# Patient Record
Sex: Male | Born: 1935 | Race: Black or African American | Hispanic: No | Smoking: Former smoker
Health system: Southern US, Community
[De-identification: ages and names within clinical notes are randomized; demographics above are authoritative.]

## PROBLEM LIST (undated history)

## (undated) DIAGNOSIS — D509 Iron deficiency anemia, unspecified: Secondary | ICD-10-CM

## (undated) DIAGNOSIS — I2510 Atherosclerotic heart disease of native coronary artery without angina pectoris: Secondary | ICD-10-CM

## (undated) DIAGNOSIS — Z95810 Presence of automatic (implantable) cardiac defibrillator: Secondary | ICD-10-CM

## (undated) DIAGNOSIS — M1990 Unspecified osteoarthritis, unspecified site: Secondary | ICD-10-CM

## (undated) DIAGNOSIS — I10 Essential (primary) hypertension: Secondary | ICD-10-CM

## (undated) DIAGNOSIS — I509 Heart failure, unspecified: Secondary | ICD-10-CM

## (undated) DIAGNOSIS — E7800 Pure hypercholesterolemia, unspecified: Secondary | ICD-10-CM

## (undated) DIAGNOSIS — J189 Pneumonia, unspecified organism: Secondary | ICD-10-CM

## (undated) DIAGNOSIS — J449 Chronic obstructive pulmonary disease, unspecified: Secondary | ICD-10-CM

## (undated) DIAGNOSIS — I219 Acute myocardial infarction, unspecified: Secondary | ICD-10-CM

## (undated) DIAGNOSIS — N183 Chronic kidney disease, stage 3 unspecified: Secondary | ICD-10-CM

## (undated) DIAGNOSIS — Z9981 Dependence on supplemental oxygen: Secondary | ICD-10-CM

## (undated) HISTORY — PX: INSERT / REPLACE / REMOVE PACEMAKER: SUR710

## (undated) HISTORY — PX: CARDIAC CATHETERIZATION: SHX172

## (undated) HISTORY — PX: CORONARY ARTERY BYPASS GRAFT: SHX141

---

## 2016-10-05 ENCOUNTER — Emergency Department (HOSPITAL_COMMUNITY): Payer: Medicare (Managed Care)

## 2016-10-05 ENCOUNTER — Encounter (HOSPITAL_COMMUNITY): Payer: Self-pay | Admitting: Emergency Medicine

## 2016-10-05 ENCOUNTER — Inpatient Hospital Stay (HOSPITAL_COMMUNITY)
Admission: EM | Admit: 2016-10-05 | Discharge: 2016-10-08 | DRG: 292 | Disposition: A | Discharge status: Home Health | Payer: Medicare (Managed Care) | Attending: Internal Medicine | Admitting: Internal Medicine

## 2016-10-05 DIAGNOSIS — Z9981 Dependence on supplemental oxygen: Secondary | ICD-10-CM

## 2016-10-05 DIAGNOSIS — D649 Anemia, unspecified: Secondary | ICD-10-CM | POA: Diagnosis present

## 2016-10-05 DIAGNOSIS — Z79899 Other long term (current) drug therapy: Secondary | ICD-10-CM

## 2016-10-05 DIAGNOSIS — Z7951 Long term (current) use of inhaled steroids: Secondary | ICD-10-CM

## 2016-10-05 DIAGNOSIS — I5021 Acute systolic (congestive) heart failure: Secondary | ICD-10-CM

## 2016-10-05 DIAGNOSIS — J441 Chronic obstructive pulmonary disease with (acute) exacerbation: Secondary | ICD-10-CM | POA: Diagnosis present

## 2016-10-05 DIAGNOSIS — J9611 Chronic respiratory failure with hypoxia: Secondary | ICD-10-CM | POA: Diagnosis present

## 2016-10-05 DIAGNOSIS — J449 Chronic obstructive pulmonary disease, unspecified: Secondary | ICD-10-CM | POA: Diagnosis present

## 2016-10-05 DIAGNOSIS — R011 Cardiac murmur, unspecified: Secondary | ICD-10-CM | POA: Diagnosis present

## 2016-10-05 DIAGNOSIS — Z87891 Personal history of nicotine dependence: Secondary | ICD-10-CM

## 2016-10-05 DIAGNOSIS — I509 Heart failure, unspecified: Secondary | ICD-10-CM

## 2016-10-05 DIAGNOSIS — Z95810 Presence of automatic (implantable) cardiac defibrillator: Secondary | ICD-10-CM

## 2016-10-05 DIAGNOSIS — I5023 Acute on chronic systolic (congestive) heart failure: Principal | ICD-10-CM | POA: Diagnosis present

## 2016-10-05 DIAGNOSIS — I2510 Atherosclerotic heart disease of native coronary artery without angina pectoris: Secondary | ICD-10-CM | POA: Diagnosis present

## 2016-10-05 DIAGNOSIS — Z7982 Long term (current) use of aspirin: Secondary | ICD-10-CM

## 2016-10-05 DIAGNOSIS — N189 Chronic kidney disease, unspecified: Secondary | ICD-10-CM | POA: Diagnosis present

## 2016-10-05 DIAGNOSIS — R0602 Shortness of breath: Secondary | ICD-10-CM | POA: Diagnosis not present

## 2016-10-05 DIAGNOSIS — I440 Atrioventricular block, first degree: Secondary | ICD-10-CM | POA: Diagnosis present

## 2016-10-05 DIAGNOSIS — I4510 Unspecified right bundle-branch block: Secondary | ICD-10-CM | POA: Diagnosis present

## 2016-10-05 DIAGNOSIS — D696 Thrombocytopenia, unspecified: Secondary | ICD-10-CM | POA: Diagnosis present

## 2016-10-05 DIAGNOSIS — I255 Ischemic cardiomyopathy: Secondary | ICD-10-CM | POA: Diagnosis present

## 2016-10-05 DIAGNOSIS — N184 Chronic kidney disease, stage 4 (severe): Secondary | ICD-10-CM | POA: Diagnosis present

## 2016-10-05 DIAGNOSIS — Z951 Presence of aortocoronary bypass graft: Secondary | ICD-10-CM

## 2016-10-05 HISTORY — DX: Essential (primary) hypertension: I10

## 2016-10-05 HISTORY — DX: Unspecified osteoarthritis, unspecified site: M19.90

## 2016-10-05 HISTORY — DX: Pneumonia, unspecified organism: J18.9

## 2016-10-05 HISTORY — DX: Pure hypercholesterolemia, unspecified: E78.00

## 2016-10-05 HISTORY — DX: Iron deficiency anemia, unspecified: D50.9

## 2016-10-05 HISTORY — DX: Atherosclerotic heart disease of native coronary artery without angina pectoris: I25.10

## 2016-10-05 HISTORY — DX: Dependence on supplemental oxygen: Z99.81

## 2016-10-05 HISTORY — DX: Acute myocardial infarction, unspecified: I21.9

## 2016-10-05 HISTORY — DX: Heart failure, unspecified: I50.9

## 2016-10-05 HISTORY — DX: Chronic obstructive pulmonary disease, unspecified: J44.9

## 2016-10-05 HISTORY — DX: Chronic kidney disease, stage 3 (moderate): N18.3

## 2016-10-05 HISTORY — DX: Presence of automatic (implantable) cardiac defibrillator: Z95.810

## 2016-10-05 HISTORY — DX: Chronic kidney disease, stage 3 unspecified: N18.30

## 2016-10-05 LAB — CBC WITH DIFFERENTIAL/PLATELET
BASOS PCT: 0 %
Basophils Absolute: 0 10*3/uL (ref 0.0–0.1)
EOS ABS: 0 10*3/uL (ref 0.0–0.7)
EOS PCT: 0 %
HCT: 35.7 % — ABNORMAL LOW (ref 39.0–52.0)
HEMOGLOBIN: 11 g/dL — AB (ref 13.0–17.0)
Lymphocytes Relative: 22 %
Lymphs Abs: 1.2 10*3/uL (ref 0.7–4.0)
MCH: 26.6 pg (ref 26.0–34.0)
MCHC: 30.8 g/dL (ref 30.0–36.0)
MCV: 86.2 fL (ref 78.0–100.0)
Monocytes Absolute: 0.6 10*3/uL (ref 0.1–1.0)
Monocytes Relative: 11 %
NEUTROS PCT: 67 %
Neutro Abs: 3.8 10*3/uL (ref 1.7–7.7)
PLATELETS: 92 10*3/uL — AB (ref 150–400)
RBC: 4.14 MIL/uL — AB (ref 4.22–5.81)
RDW: 16.7 % — ABNORMAL HIGH (ref 11.5–15.5)
WBC: 5.6 10*3/uL (ref 4.0–10.5)

## 2016-10-05 LAB — BASIC METABOLIC PANEL
Anion gap: 8 (ref 5–15)
BUN: 22 mg/dL — AB (ref 6–20)
CHLORIDE: 103 mmol/L (ref 101–111)
CO2: 29 mmol/L (ref 22–32)
CREATININE: 1.68 mg/dL — AB (ref 0.61–1.24)
Calcium: 10 mg/dL (ref 8.9–10.3)
GFR, EST AFRICAN AMERICAN: 42 mL/min — AB (ref >60)
GFR, EST NON AFRICAN AMERICAN: 36 mL/min — AB (ref >60)
Glucose, Bld: 141 mg/dL — ABNORMAL HIGH (ref 65–99)
Potassium: 4.6 mmol/L (ref 3.5–5.1)
SODIUM: 140 mmol/L (ref 135–145)

## 2016-10-05 LAB — I-STAT TROPONIN, ED: TROPONIN I, POC: 0.04 ng/mL (ref 0.00–0.08)

## 2016-10-05 LAB — BRAIN NATRIURETIC PEPTIDE: B Natriuretic Peptide: >4500 pg/mL — ABNORMAL HIGH (ref 0.0–100.0)

## 2016-10-05 MED ORDER — FUROSEMIDE 10 MG/ML IJ SOLN
40.0000 mg | Freq: Once | INTRAMUSCULAR | Status: AC
  Administered 2016-10-05: 40 mg via INTRAVENOUS
  Filled 2016-10-05: qty 4

## 2016-10-05 MED ORDER — IPRATROPIUM BROMIDE 0.02 % IN SOLN
0.5000 mg | Freq: Once | RESPIRATORY_TRACT | Status: AC
  Administered 2016-10-05: 0.5 mg via RESPIRATORY_TRACT
  Filled 2016-10-05: qty 2.5

## 2016-10-05 MED ORDER — ALBUTEROL SULFATE (2.5 MG/3ML) 0.083% IN NEBU
5.0000 mg | INHALATION_SOLUTION | Freq: Once | RESPIRATORY_TRACT | Status: AC
  Administered 2016-10-05: 5 mg via RESPIRATORY_TRACT
  Filled 2016-10-05: qty 6

## 2016-10-05 NOTE — ED Provider Notes (Signed)
Medical screening examination/treatment/procedure(s) were conducted as a shared visit with non-physician practitioner(s) and myself.  I personally evaluated the patient during the encounter.  Moved here from [place] a few days ago and has had worsening shortness of breath since then. History on oxygen but has not had it. Has history of heart failure secondary to multiple MIs as well. On my exam he is given a breathing treatment and is in no respirator distress. He does however have crackles and 2+ pedal edema to at least the mid shins. Fluid distended abdomen as well no obvious JVD or S3. Have a PCP in the area nor does he have all his medications and the ability to follow closely as an outpatient. Likely will need to be admitted for diuresis  get those things set up and the oxygen as well   EKG Interpretation  Date/Time:  Wednesday October 05 2016 17:24:13 EST Ventricular Rate:  83 PR Interval:  258 QRS Duration: 126 QT Interval:  418 QTC Calculation: 491 R Axis:   -13 Text Interpretation:  Sinus rhythm with 1st degree A-V block with Premature supraventricular complexes Left ventricular hypertrophy with QRS widening and repolarization abnormality Abnormal ECG When compared with ECG of EARLIER SAME DATE No significant change was found Confirmed by Preston FleetingGLICK  MD, DAVID ([id number]) on 10/05/2016 5:52:20 PM         Marily MemosJason Renee Beale, MD 10/06/16 [phone]

## 2016-10-05 NOTE — ED Provider Notes (Signed)
[facility]-EMERGENCY DEPT Provider Note   CSN: [id number] Arrival date & time: 10/05/16  1706     History   Chief Complaint Chief Complaint  Patient presents with  . Shortness of Breath    HPI Darrell Glass is a 83 y.o. male.  HPI   83 year old male with history of COPD, CAD currently on home O2 presenting with complaints of shortness of breath. Patient was living in [place], but recently moved down to [place] several days ago to be with his daughter and granddaughter. He normally wear supplemental oxygen at 4 L for many years but since the move, he was not allowed to take his supplemental oxygen as it belongs to the medical company.  Since been without his supplemental oxygen, he endorsed worsening shortness of breath. He also endorsed a cough occasionally productive with yellow sputum and trace of blood for the past 2 weeks. This cough was started before moving down from [place].  He was treated for bronchitis with steroid and abx, which he has finished the full course. His shortness of breath is persistent, worsening with laying flat or with exertion. Patient walks with a cane. He has a 3 pillow orthopnea, complaining of bilateral leg swelling for the past week. Does have history of CHF and has been compliant with his medication including Lasix. Denies having any fever but does endorse chills. Denies lightheadedness, dizziness, chest pain, focal numbness or weakness or rash.  Past Medical History:  Diagnosis Date  . COPD (chronic obstructive pulmonary disease) (HCC)   . Coronary artery disease     There are no active problems to display for this patient.   History reviewed. No pertinent surgical history.     Home Medications    Prior to Admission medications   Not on File    Family History History reviewed. No pertinent family history.  Social History Social History  Substance Use Topics  . Smoking status: Never Smoker  . Smokeless tobacco: Never Used  . Alcohol  use No     Allergies   Patient has no known allergies.   Review of Systems Review of Systems  All other systems reviewed and are negative.    Physical Exam Updated Vital Signs BP 129/63 (BP Location: Left Arm)   Pulse 80   Temp 98.4 F (36.9 C) (Oral)   Resp 16   Ht 5' 8.5" (1.74 m)   Wt 63.5 kg   SpO2 98%   BMI 20.98 kg/m   Physical Exam  Constitutional: No distress.  Chronically ill-appearing male appears to be in mild acute respiratory distress  HENT:  Head: Atraumatic.  Eyes: Conjunctivae are normal.  Neck: Neck supple. No JVD present.  Cardiovascular: Normal rate and regular rhythm.   Pulmonary/Chest: He has wheezes (Faint expiratory wheezes without significant crackles or rales).  Abdominal: Soft. He exhibits no distension. There is no tenderness.  Musculoskeletal: He exhibits edema (1+ pitting edema to bilateral lower extremities).  Neurological: He is alert.  Skin: No rash noted.  Psychiatric: He has a normal mood and affect.  Nursing note and vitals reviewed.    ED Treatments / Results  Labs (all labs ordered are listed, but only abnormal results are displayed) Labs Reviewed  CBC WITH DIFFERENTIAL/PLATELET - Abnormal; Notable for the following:       Result Value   RBC 4.14 (*)    Hemoglobin 11.0 (*)    HCT 35.7 (*)    RDW 16.7 (*)    Platelets 92 (*)  All other components within normal limits  BASIC METABOLIC PANEL - Abnormal; Notable for the following:    Glucose, Bld 141 (*)    BUN 22 (*)    Creatinine, Ser 1.68 (*)    GFR calc non Af Amer 36 (*)    GFR calc Af Amer 42 (*)    All other components within normal limits  BRAIN NATRIURETIC PEPTIDE - Abnormal; Notable for the following:    B Natriuretic Peptide >4,500.0 (*)    All other components within normal limits  I-STAT TROPOININ, ED    EKG  EKG Interpretation  Date/Time:  Wednesday October 05 2016 17:24:13 EST Ventricular Rate:  83 PR Interval:  258 QRS Duration: 126 QT  Interval:  418 QTC Calculation: 491 R Axis:   -13 Text Interpretation:  Sinus rhythm with 1st degree A-V block with Premature supraventricular complexes Left ventricular hypertrophy with QRS widening and repolarization abnormality Abnormal ECG When compared with ECG of EARLIER SAME DATE No significant change was found Confirmed by Preston Fleeting  MD, DAVID ([id number]) on 10/05/2016 5:52:20 PM       Radiology Dg Chest 2 View  Result Date: 10/05/2016 CLINICAL DATA:  Chronic shortness of breath. EXAM: CHEST  2 VIEW COMPARISON:  None. FINDINGS: Permanent left-sided pacemaker with pacer wires in good position without complicating features. Surgical changes from bypass surgery. The heart is enlarged. There is tortuosity, ectasia and calcification of the thoracic aorta. The lungs are grossly clear. No infiltrates or effusions. Chronic appearing bronchitic changes. The upper abdominal bowel gas pattern is unremarkable. The bony structures are intact. IMPRESSION: Cardiac enlargement but no acute pulmonary findings. Electronically Signed   By: Rudie Meyer M.D.   On: 10/05/2016 17:52    Procedures Procedures (including critical care time)  Medications Ordered in ED Medications  albuterol (PROVENTIL) (2.5 MG/3ML) 0.083% nebulizer solution 5 mg (5 mg Nebulization Given 10/05/16 2207)  ipratropium (ATROVENT) nebulizer solution 0.5 mg (0.5 mg Nebulization Given 10/05/16 2207)  furosemide (LASIX) injection 40 mg (40 mg Intravenous Given 10/05/16 2335)     Initial Impression / Assessment and Plan / ED Course  I have reviewed the triage vital signs and the nursing notes.  Pertinent labs & imaging results that were available during my care of the patient were reviewed by me and considered in my medical decision making (see chart for details).     BP 129/77   Pulse 78   Temp 98.4 F (36.9 C) (Oral)   Resp (!) 27   Ht 5' 8.5" (1.74 m)   Wt 63.5 kg   SpO2 100%   BMI 20.98 kg/m    Final Clinical Impressions(s)  / ED Diagnoses   Final diagnoses:  COPD exacerbation (HCC)  Acute on chronic congestive heart failure, unspecified congestive heart failure type (HCC)    New Prescriptions New Prescriptions   No medications on file   9:31 PM Patient with history of COPD, CHF, on chronic supplemental oxygen at 4 L here with worsening shortness of breath, productive cough and fluid retention to his lower extremities. He has been without his supplemental oxygen for the past several days from not having access to equipment. His lab is concerning for an elevated BNP of greater than 4500 suggestive of CHF exacerbation. Chest x-ray without acute pulmonary findings. Evidence of mild renal insufficiency with creatinine 1.68, BUN 22.  11:54 PM Appreciate consultation from [facility] Dr. Toniann Fail who agrees to see and admit pt for further management.  Pt agrees with plan.  Care discussed with Dr. Clayborne Dana. Pt given Lasix 40mg  via IV.  Albuterol/atrovent nebs given as well with some improvement.  Pt on O2 at 4L.    Fayrene Helper, PA-C 10/05/16 2356    Marily Memos, MD 10/06/16 [phone]

## 2016-10-05 NOTE — ED Triage Notes (Signed)
Pt sts SOB with hx of COPD; pt just moved here and has been without his O2

## 2016-10-06 ENCOUNTER — Encounter (HOSPITAL_COMMUNITY): Payer: Self-pay | Admitting: Internal Medicine

## 2016-10-06 ENCOUNTER — Observation Stay (HOSPITAL_BASED_OUTPATIENT_CLINIC_OR_DEPARTMENT_OTHER): Payer: Medicare (Managed Care)

## 2016-10-06 DIAGNOSIS — Z7982 Long term (current) use of aspirin: Secondary | ICD-10-CM | POA: Diagnosis not present

## 2016-10-06 DIAGNOSIS — Z7951 Long term (current) use of inhaled steroids: Secondary | ICD-10-CM | POA: Diagnosis not present

## 2016-10-06 DIAGNOSIS — Z87891 Personal history of nicotine dependence: Secondary | ICD-10-CM | POA: Diagnosis not present

## 2016-10-06 DIAGNOSIS — J9611 Chronic respiratory failure with hypoxia: Secondary | ICD-10-CM | POA: Diagnosis present

## 2016-10-06 DIAGNOSIS — D696 Thrombocytopenia, unspecified: Secondary | ICD-10-CM | POA: Diagnosis present

## 2016-10-06 DIAGNOSIS — N189 Chronic kidney disease, unspecified: Secondary | ICD-10-CM | POA: Diagnosis present

## 2016-10-06 DIAGNOSIS — R609 Edema, unspecified: Secondary | ICD-10-CM

## 2016-10-06 DIAGNOSIS — I255 Ischemic cardiomyopathy: Secondary | ICD-10-CM | POA: Diagnosis present

## 2016-10-06 DIAGNOSIS — N183 Chronic kidney disease, stage 3 (moderate): Secondary | ICD-10-CM | POA: Diagnosis not present

## 2016-10-06 DIAGNOSIS — R0602 Shortness of breath: Secondary | ICD-10-CM | POA: Diagnosis present

## 2016-10-06 DIAGNOSIS — I509 Heart failure, unspecified: Secondary | ICD-10-CM

## 2016-10-06 DIAGNOSIS — Z95810 Presence of automatic (implantable) cardiac defibrillator: Secondary | ICD-10-CM | POA: Diagnosis not present

## 2016-10-06 DIAGNOSIS — R011 Cardiac murmur, unspecified: Secondary | ICD-10-CM | POA: Diagnosis present

## 2016-10-06 DIAGNOSIS — Z9981 Dependence on supplemental oxygen: Secondary | ICD-10-CM | POA: Diagnosis not present

## 2016-10-06 DIAGNOSIS — I361 Nonrheumatic tricuspid (valve) insufficiency: Secondary | ICD-10-CM

## 2016-10-06 DIAGNOSIS — I4510 Unspecified right bundle-branch block: Secondary | ICD-10-CM | POA: Diagnosis present

## 2016-10-06 DIAGNOSIS — I5023 Acute on chronic systolic (congestive) heart failure: Secondary | ICD-10-CM | POA: Diagnosis present

## 2016-10-06 DIAGNOSIS — J42 Unspecified chronic bronchitis: Secondary | ICD-10-CM | POA: Diagnosis not present

## 2016-10-06 DIAGNOSIS — D509 Iron deficiency anemia, unspecified: Secondary | ICD-10-CM | POA: Diagnosis not present

## 2016-10-06 DIAGNOSIS — Z79899 Other long term (current) drug therapy: Secondary | ICD-10-CM | POA: Diagnosis not present

## 2016-10-06 DIAGNOSIS — D649 Anemia, unspecified: Secondary | ICD-10-CM | POA: Diagnosis present

## 2016-10-06 DIAGNOSIS — N184 Chronic kidney disease, stage 4 (severe): Secondary | ICD-10-CM | POA: Diagnosis present

## 2016-10-06 DIAGNOSIS — I2510 Atherosclerotic heart disease of native coronary artery without angina pectoris: Secondary | ICD-10-CM | POA: Diagnosis present

## 2016-10-06 DIAGNOSIS — I440 Atrioventricular block, first degree: Secondary | ICD-10-CM | POA: Diagnosis present

## 2016-10-06 DIAGNOSIS — J441 Chronic obstructive pulmonary disease with (acute) exacerbation: Secondary | ICD-10-CM | POA: Diagnosis present

## 2016-10-06 DIAGNOSIS — J449 Chronic obstructive pulmonary disease, unspecified: Secondary | ICD-10-CM | POA: Diagnosis present

## 2016-10-06 DIAGNOSIS — Z951 Presence of aortocoronary bypass graft: Secondary | ICD-10-CM | POA: Diagnosis not present

## 2016-10-06 DIAGNOSIS — I5021 Acute systolic (congestive) heart failure: Secondary | ICD-10-CM | POA: Diagnosis not present

## 2016-10-06 LAB — CBC WITH DIFFERENTIAL/PLATELET
BASOS PCT: 0 %
Basophils Absolute: 0 10*3/uL (ref 0.0–0.1)
EOS ABS: 0.1 10*3/uL (ref 0.0–0.7)
EOS PCT: 1 %
HCT: 30.2 % — ABNORMAL LOW (ref 39.0–52.0)
HEMOGLOBIN: 9.3 g/dL — AB (ref 13.0–17.0)
Lymphocytes Relative: 28 %
Lymphs Abs: 1.3 10*3/uL (ref 0.7–4.0)
MCH: 26.4 pg (ref 26.0–34.0)
MCHC: 30.8 g/dL (ref 30.0–36.0)
MCV: 85.8 fL (ref 78.0–100.0)
MONO ABS: 0.4 10*3/uL (ref 0.1–1.0)
MONOS PCT: 9 %
NEUTROS PCT: 62 %
Neutro Abs: 3 10*3/uL (ref 1.7–7.7)
PLATELETS: 79 10*3/uL — AB (ref 150–400)
RBC: 3.52 MIL/uL — ABNORMAL LOW (ref 4.22–5.81)
RDW: 16.6 % — AB (ref 11.5–15.5)
WBC: 4.9 10*3/uL (ref 4.0–10.5)

## 2016-10-06 LAB — COMPREHENSIVE METABOLIC PANEL
ALK PHOS: 59 U/L (ref 38–126)
ALT: 36 U/L (ref 17–63)
AST: 31 U/L (ref 15–41)
Albumin: 2.8 g/dL — ABNORMAL LOW (ref 3.5–5.0)
Anion gap: 7 (ref 5–15)
BUN: 23 mg/dL — AB (ref 6–20)
CALCIUM: 9.1 mg/dL (ref 8.9–10.3)
CO2: 28 mmol/L (ref 22–32)
CREATININE: 1.76 mg/dL — AB (ref 0.61–1.24)
Chloride: 102 mmol/L (ref 101–111)
GFR calc non Af Amer: 34 mL/min — ABNORMAL LOW (ref >60)
GFR, EST AFRICAN AMERICAN: 40 mL/min — AB (ref >60)
Glucose, Bld: 169 mg/dL — ABNORMAL HIGH (ref 65–99)
Potassium: 3.3 mmol/L — ABNORMAL LOW (ref 3.5–5.1)
SODIUM: 137 mmol/L (ref 135–145)
Total Bilirubin: 1.9 mg/dL — ABNORMAL HIGH (ref 0.3–1.2)
Total Protein: 5.9 g/dL — ABNORMAL LOW (ref 6.5–8.1)

## 2016-10-06 LAB — IRON AND TIBC
Iron: 34 ug/dL — ABNORMAL LOW (ref 45–182)
Saturation Ratios: 10 % — ABNORMAL LOW (ref 17.9–39.5)
TIBC: 349 ug/dL (ref 250–450)
UIBC: 315 ug/dL

## 2016-10-06 LAB — URINALYSIS, ROUTINE W REFLEX MICROSCOPIC
Bilirubin Urine: NEGATIVE
GLUCOSE, UA: NEGATIVE mg/dL
HGB URINE DIPSTICK: NEGATIVE
Ketones, ur: NEGATIVE mg/dL
Leukocytes, UA: NEGATIVE
Nitrite: NEGATIVE
Protein, ur: NEGATIVE mg/dL
SPECIFIC GRAVITY, URINE: 1.005 (ref 1.005–1.030)
pH: 6 (ref 5.0–8.0)

## 2016-10-06 LAB — LACTATE DEHYDROGENASE: LDH: 171 U/L (ref 98–192)

## 2016-10-06 LAB — ECHOCARDIOGRAM COMPLETE
HEIGHTINCHES: 68 in
WEIGHTICAEL: 2340.8 [oz_av]

## 2016-10-06 LAB — TSH: TSH: 1.529 u[IU]/mL (ref 0.350–4.500)

## 2016-10-06 LAB — RETICULOCYTES
RBC.: 3.88 MIL/uL — ABNORMAL LOW (ref 4.22–5.81)
RETIC COUNT ABSOLUTE: 42.7 10*3/uL (ref 19.0–186.0)
RETIC CT PCT: 1.1 % (ref 0.4–3.1)

## 2016-10-06 LAB — MAGNESIUM: Magnesium: 1.9 mg/dL (ref 1.7–2.4)

## 2016-10-06 LAB — FERRITIN: FERRITIN: 49 ng/mL (ref 24–336)

## 2016-10-06 LAB — VITAMIN B12: Vitamin B-12: 574 pg/mL (ref 180–914)

## 2016-10-06 LAB — TROPONIN I
TROPONIN I: 0.05 ng/mL — AB (ref <0.03)
Troponin I: 0.06 ng/mL (ref <0.03)
Troponin I: 0.05 ng/mL (ref <0.03)

## 2016-10-06 LAB — FOLATE: Folate: 35.6 ng/mL (ref >5.9)

## 2016-10-06 MED ORDER — ATORVASTATIN CALCIUM 40 MG PO TABS
40.0000 mg | ORAL_TABLET | Freq: Every day | ORAL | Status: DC
  Administered 2016-10-06 – 2016-10-07 (×2): 40 mg via ORAL
  Filled 2016-10-06 (×2): qty 1

## 2016-10-06 MED ORDER — DOCUSATE SODIUM 100 MG PO CAPS
100.0000 mg | ORAL_CAPSULE | Freq: Two times a day (BID) | ORAL | Status: DC
  Administered 2016-10-06 – 2016-10-08 (×5): 100 mg via ORAL
  Filled 2016-10-06 (×5): qty 1

## 2016-10-06 MED ORDER — PSYLLIUM 95 % PO PACK
1.0000 | PACK | Freq: Every day | ORAL | Status: DC
  Administered 2016-10-07 – 2016-10-08 (×2): 1 via ORAL
  Filled 2016-10-06 (×2): qty 1

## 2016-10-06 MED ORDER — SPIRONOLACTONE 25 MG PO TABS
25.0000 mg | ORAL_TABLET | Freq: Every day | ORAL | Status: DC
  Administered 2016-10-06 – 2016-10-08 (×3): 25 mg via ORAL
  Filled 2016-10-06 (×3): qty 1

## 2016-10-06 MED ORDER — FLUTICASONE FUROATE-VILANTEROL 100-25 MCG/INH IN AEPB
1.0000 | INHALATION_SPRAY | Freq: Every day | RESPIRATORY_TRACT | Status: DC
  Administered 2016-10-07 – 2016-10-08 (×2): 1 via RESPIRATORY_TRACT
  Filled 2016-10-06: qty 28

## 2016-10-06 MED ORDER — ACETAMINOPHEN 650 MG RE SUPP: 650.0000 mg | Freq: Four times a day (QID) | RECTAL | Status: DC | PRN

## 2016-10-06 MED ORDER — FUROSEMIDE 10 MG/ML IJ SOLN
40.0000 mg | Freq: Two times a day (BID) | INTRAMUSCULAR | Status: DC
  Administered 2016-10-06 – 2016-10-07 (×3): 40 mg via INTRAVENOUS
  Filled 2016-10-06 (×3): qty 4

## 2016-10-06 MED ORDER — ALBUTEROL SULFATE (2.5 MG/3ML) 0.083% IN NEBU
3.0000 mL | INHALATION_SOLUTION | Freq: Four times a day (QID) | RESPIRATORY_TRACT | Status: DC | PRN
Start: 2016-10-06 — End: 2016-10-08
  Administered 2016-10-06: 3 mL via RESPIRATORY_TRACT

## 2016-10-06 MED ORDER — FINASTERIDE 5 MG PO TABS
5.0000 mg | ORAL_TABLET | Freq: Every evening | ORAL | Status: DC
  Administered 2016-10-06 – 2016-10-07 (×2): 5 mg via ORAL
  Filled 2016-10-06 (×2): qty 1

## 2016-10-06 MED ORDER — TAMSULOSIN HCL 0.4 MG PO CAPS
0.4000 mg | ORAL_CAPSULE | Freq: Every day | ORAL | Status: DC
  Administered 2016-10-06 – 2016-10-07 (×2): 0.4 mg via ORAL
  Filled 2016-10-06 (×2): qty 1

## 2016-10-06 MED ORDER — CARVEDILOL 12.5 MG PO TABS
12.5000 mg | ORAL_TABLET | Freq: Two times a day (BID) | ORAL | Status: DC
  Administered 2016-10-06 – 2016-10-08 (×5): 12.5 mg via ORAL
  Filled 2016-10-06 (×5): qty 1

## 2016-10-06 MED ORDER — ONDANSETRON HCL 4 MG/2ML IJ SOLN: 4.0000 mg | Freq: Four times a day (QID) | INTRAMUSCULAR | Status: DC | PRN

## 2016-10-06 MED ORDER — LISINOPRIL 2.5 MG PO TABS
2.5000 mg | ORAL_TABLET | Freq: Every day | ORAL | Status: DC
Start: 2016-10-06 — End: 2016-10-08
  Administered 2016-10-06 – 2016-10-08 (×3): 2.5 mg via ORAL
  Filled 2016-10-06 (×3): qty 1

## 2016-10-06 MED ORDER — ACETAMINOPHEN 325 MG PO TABS: 650.0000 mg | ORAL_TABLET | Freq: Four times a day (QID) | ORAL | Status: DC | PRN

## 2016-10-06 MED ORDER — ONDANSETRON HCL 4 MG PO TABS: 4.0000 mg | ORAL_TABLET | Freq: Four times a day (QID) | ORAL | Status: DC | PRN

## 2016-10-06 MED ORDER — PERFLUTREN LIPID MICROSPHERE
1.0000 mL | INTRAVENOUS | Status: AC | PRN
  Administered 2016-10-06: 2 mL via INTRAVENOUS
  Filled 2016-10-06: qty 10

## 2016-10-06 MED ORDER — FAMOTIDINE 20 MG PO TABS
20.0000 mg | ORAL_TABLET | Freq: Two times a day (BID) | ORAL | Status: DC
  Administered 2016-10-06 – 2016-10-08 (×5): 20 mg via ORAL
  Filled 2016-10-06 (×5): qty 1

## 2016-10-06 MED ORDER — ASPIRIN EC 81 MG PO TBEC
81.0000 mg | DELAYED_RELEASE_TABLET | Freq: Every day | ORAL | Status: DC
  Administered 2016-10-06 – 2016-10-08 (×3): 81 mg via ORAL
  Filled 2016-10-06 (×3): qty 1

## 2016-10-06 NOTE — Progress Notes (Signed)
  Echocardiogram 2D Echocardiogram with Definity has been performed.  Darrell Glass, Darrell Glass 10/06/2016, 12:15 PM

## 2016-10-06 NOTE — Progress Notes (Signed)
SATURATION QUALIFICATIONS: (This note is used to comply with regulatory documentation for home oxygen)  Patient Saturations on Room Air at Rest = 92%  Patient Saturations on Room Air while Ambulating = 85%  Patient Saturations on 2 Liters of oxygen while Ambulating = 94%  Please briefly explain why patient needs home oxygen: Patient's O2 saturation dropped to 86% after ambulating 200 feet in the hallway.  O2 saturation rose to 92-94% with 2 liters of oxygen applied.

## 2016-10-06 NOTE — Progress Notes (Signed)
*  PRELIMINARY RESULTS* Vascular Ultrasound Bilateral lower extremity venous duplex has been completed.  Preliminary findings: No evidence of deep vein thrombosis or baker's cysts bilaterally.   Chauncey FischerCharlotte C Lorey Pallett 10/06/2016, 12:29 PM

## 2016-10-06 NOTE — H&P (Addendum)
History and Physical    Darrell Glass ZOX:[id number] DOB: 06-Jun-1933 DOA: 10/05/2016  PCP: No PCP Per Patient  Patient coming from: Home.  Chief Complaint: Shortness of breath.  HPI: Darrell Glass is a 83 y.o. male with history of CAD status post CABG and defibrillator placement who has recently moved from [place] last week presents to the ER because of increasing shortness of breath. As per the daughter patient was getting short of breath on minimal exertion. Denies any chest pain or productive cough. Patient has ran out off his oxygen supply.  ED Course: On exam in the ER patient has bilateral lower extremity edema with JVD elevation. Chest x-ray does show cardiomegaly but no acute findings. BNP is markedly elevated. Patient was given Lasix and admitted for further management.  Review of Systems: As per HPI, rest all negative.   Past Medical History:  Diagnosis Date  . CHF (congestive heart failure) (HCC)   . COPD (chronic obstructive pulmonary disease) (HCC)   . Coronary artery disease   . Presence of permanent cardiac pacemaker     Past Surgical History:  Procedure Laterality Date  . CARDIAC CATHETERIZATION    . CORONARY ARTERY BYPASS GRAFT    . PACEMAKER INSERTION       reports that he has quit smoking. He has never used smokeless tobacco. He reports that he does not drink alcohol or use drugs.  No Known Allergies  Family History  Problem Relation Age of Onset  . CAD Neg Hx   . Diabetes Mellitus II Neg Hx     Prior to Admission medications   Medication Sig Start Date End Date Taking? Authorizing Provider  albuterol (PROVENTIL HFA;VENTOLIN HFA) 108 (90 Base) MCG/ACT inhaler Inhale 1-2 puffs into the lungs every 6 (six) hours as needed for wheezing or shortness of breath.   Yes Historical Provider, MD  aspirin EC 81 MG tablet Take 81 mg by mouth daily.   Yes Historical Provider, MD  atorvastatin (LIPITOR) 40 MG tablet Take 40 mg by mouth at bedtime.   Yes Historical  Provider, MD  carvedilol (COREG) 12.5 MG tablet Take 12.5 mg by mouth 2 (two) times daily with a meal.   Yes Historical Provider, MD  docusate sodium (COLACE) 100 MG capsule Take 100 mg by mouth 2 (two) times daily.   Yes Historical Provider, MD  famotidine (PEPCID) 20 MG tablet Take 20 mg by mouth daily.   Yes Historical Provider, MD  finasteride (PROSCAR) 5 MG tablet Take 5 mg by mouth every evening.   Yes Historical Provider, MD  fluticasone furoate-vilanterol (BREO ELLIPTA) 100-25 MCG/INH AEPB Inhale 1 puff into the lungs every morning.   Yes Historical Provider, MD  furosemide (LASIX) 40 MG tablet Take 40 mg by mouth every morning.   Yes Historical Provider, MD  lisinopril (PRINIVIL,ZESTRIL) 2.5 MG tablet Take 2.5 mg by mouth every morning.   Yes Historical Provider, MD  Multiple Vitamin (MULTIVITAMIN WITH MINERALS) TABS tablet Take 1 tablet by mouth daily.   Yes Historical Provider, MD  psyllium (HYDROCIL/METAMUCIL) 95 % PACK Take 1 packet by mouth daily.   Yes Historical Provider, MD  spironolactone (ALDACTONE) 25 MG tablet Take 25 mg by mouth every morning.   Yes Historical Provider, MD  tamsulosin (FLOMAX) 0.4 MG CAPS capsule Take 0.4 mg by mouth daily after breakfast.   Yes Historical Provider, MD    Physical Exam: Vitals:   10/06/16 0030 10/06/16 0145 10/06/16 0146 10/06/16 0150  BP: 125/75 125/76  Pulse: 91 85    Resp: 24     Temp:  97.6 F (36.4 C)    TempSrc:  Oral    SpO2: 97% 99%  100%  Weight:   66.4 kg (146 lb 4.8 oz)   Height:   5\' 8"  (1.727 m)       Constitutional: Moderately built and nourished. Vitals:   10/06/16 0030 10/06/16 0145 10/06/16 0146 10/06/16 0150  BP: 125/75 125/76    Pulse: 91 85    Resp: 24     Temp:  97.6 F (36.4 C)    TempSrc:  Oral    SpO2: 97% 99%  100%  Weight:   66.4 kg (146 lb 4.8 oz)   Height:   5\' 8"  (1.727 m)    Eyes: Anicteric no pallor. ENMT: No discharge from the ears eyes nose or mouth. Neck: JVP elevated no mass  felt. Respiratory: No rhonchi or crepitations. Cardiovascular: S1 and S2 heard no murmurs appreciated. Abdomen: Soft nontender bowel sounds present. Musculoskeletal: Bilateral lower extremity edema. Skin: No rash. Skin appears warm. Neurologic: Alert awake oriented to time place and person. Moves all extremities. Psychiatric: Appears normal. Normal affect.   Labs on Admission: I have personally reviewed following labs and imaging studies  CBC:  Recent Labs Lab 10/05/16 1718  WBC 5.6  NEUTROABS 3.8  HGB 11.0*  HCT 35.7*  MCV 86.2  PLT 92*   Basic Metabolic Panel:  Recent Labs Lab 10/05/16 1718  NA 140  K 4.6  CL 103  CO2 29  GLUCOSE 141*  BUN 22*  CREATININE 1.68*  CALCIUM 10.0   GFR: Estimated Creatinine Clearance: 31.8 mL/min (by C-G formula based on SCr of 1.68 mg/dL (H)). Liver Function Tests: No results for input(s): AST, ALT, ALKPHOS, BILITOT, PROT, ALBUMIN in the last 168 hours. No results for input(s): LIPASE, AMYLASE in the last 168 hours. No results for input(s): AMMONIA in the last 168 hours. Coagulation Profile: No results for input(s): INR, PROTIME in the last 168 hours. Cardiac Enzymes: No results for input(s): CKTOTAL, CKMB, CKMBINDEX, TROPONINI in the last 168 hours. BNP (last 3 results) No results for input(s): PROBNP in the last 8760 hours. HbA1C: No results for input(s): HGBA1C in the last 72 hours. CBG: No results for input(s): GLUCAP in the last 168 hours. Lipid Profile: No results for input(s): CHOL, HDL, LDLCALC, TRIG, CHOLHDL, LDLDIRECT in the last 72 hours. Thyroid Function Tests: No results for input(s): TSH, T4TOTAL, FREET4, T3FREE, THYROIDAB in the last 72 hours. Anemia Panel: No results for input(s): VITAMINB12, FOLATE, FERRITIN, TIBC, IRON, RETICCTPCT in the last 72 hours. Urine analysis: No results found for: COLORURINE, APPEARANCEUR, LABSPEC, PHURINE, GLUCOSEU, HGBUR, BILIRUBINUR, KETONESUR, PROTEINUR, UROBILINOGEN, NITRITE,  LEUKOCYTESUR Sepsis Labs: @LABRCNTIP (procalcitonin:4,lacticidven:4) )No results found for this or any previous visit (from the past 240 hour(s)).   Radiological Exams on Admission: Dg Chest 2 View  Result Date: 10/05/2016 CLINICAL DATA:  Chronic shortness of breath. EXAM: CHEST  2 VIEW COMPARISON:  None. FINDINGS: Permanent left-sided pacemaker with pacer wires in good position without complicating features. Surgical changes from bypass surgery. The heart is enlarged. There is tortuosity, ectasia and calcification of the thoracic aorta. The lungs are grossly clear. No infiltrates or effusions. Chronic appearing bronchitic changes. The upper abdominal bowel gas pattern is unremarkable. The bony structures are intact. IMPRESSION: Cardiac enlargement but no acute pulmonary findings. Electronically Signed   By: Rudie Meyer M.D.   On: 10/05/2016 17:52    EKG: Independently reviewed.  Normal sinus rhythm with LVH first degree AV block.  Assessment/Plan Principal Problem:   Acute CHF (congestive heart failure) (HCC) Active Problems:   COPD (chronic obstructive pulmonary disease) (HCC)   CAD in native artery   Thrombocytopenia (HCC)   Normocytic anemia   CKD (chronic kidney disease)   Acute on chronic congestive heart failure (HCC)    1. Acute CHF EF unknown - patient has been placed on Lasix 40 mg IV every 12. Check 2-D echo cycle cardiac markers. Since patient had recent travel will check Dopplers of the lower extremities due to bilateral lower extremity edema. Patient is on lisinopril spironolactone. Patient has defibrillator. 2. COPD - on exam patient was not wheezing. Continue home inhalers. 3. Anemia and thrombocytopenia - baseline labs not known. Will check anemia panel. Will check LDH to rule out any hemolytic process. 4. Chronic kidney disease stage 3-4 - baseline creatinine not known. Check UA. If creatinine worsens may have to hold lisinopril and spironolactone. 5. CAD status post  CABG and defibrillator placement - denies any chest pain.  Please contact patient's primary care physician Dr. Jonny RuizJohn at the #[phone] in the morning to get further information.   DVT prophylaxis: SCDs due to thrombocytopenia. Code Status: Full code.  Family Communication: Patient's daughter.  Disposition Plan: Home.  Consults called: Social work.  Admission status: Observation.    Eduard ClosKAKRAKANDY,Shakeena Kafer N. MD [facility] Pager [phone].  If 7PM-7AM, please contact night-coverage [email or web address] Password [email or web address]  10/06/2016, 2:07 AM

## 2016-10-06 NOTE — Evaluation (Signed)
Physical Therapy Evaluation Patient Details Name: Darrell Glass MRN: [id number] DOB: 06/18/1933 Today's Date: 10/06/2016   History of Present Illness  Patient is a 83 y/o male with hx of pacemaker, CAD, COPD, CHF presents with SOB and swelling BLEs with JVD elevation. Elevated BNP. CXR-cardiomegaly but no acute findings. Found to have CHF exacerbation.  Clinical Impression  Patient presents with generalized weakness, dyspnea on exertion, impaired balance and decreased mobility s/p above. Pt moved from [place] recently to live with daughter and is Mod I for ADLs using RW vs SPC. Tolerated gait training with min guard assist for safety due to 2/4 dyspnea. Sp02 in mid 90s when able to get reading on 2L/min 02. Pt has support from daughter at home. Will follow acutely to maximize independence and mobility prior to return home.     Follow Up Recommendations No PT follow up;Supervision for mobility/OOB;Supervision/Assistance - 24 hour    Equipment Recommendations  None recommended by PT    Recommendations for Other Services       Precautions / Restrictions Precautions Precautions: Fall Precaution Comments: watch 02 Restrictions Weight Bearing Restrictions: No      Mobility  Bed Mobility Overal bed mobility: Needs Assistance Bed Mobility: Supine to Sit;Sit to Supine     Supine to sit: Modified independent (Device/Increase time);HOB elevated Sit to supine: Modified independent (Device/Increase time);HOB elevated   General bed mobility comments: Increased time to get to EOB. No assist needed.   Transfers Overall transfer level: Needs assistance Equipment used: Rolling walker (2 wheeled) Transfers: Sit to/from Stand Sit to Stand: Min guard         General transfer comment: Min guard for safety. Stood from [employer]. No dizziness. HR stable.  Ambulation/Gait Ambulation/Gait assistance: Min guard Ambulation Distance (Feet): 130 Feet Assistive device: Rolling walker (2 wheeled) Gait  Pattern/deviations: Step-through pattern;Decreased stride length;Trunk flexed;Narrow base of support;Drifts right/left Gait velocity: decreased   General Gait Details: Slow, mildly unsteady gait with some drifing noted, 1standing rest break. 2/4 DOE. Ambulated on 2L/min 02 [place]. Sp02 post ambulation 95% but difficult to get reading due to cold fingers.   Stairs            Wheelchair Mobility    Modified Rankin (Stroke Patients Only)       Balance Overall balance assessment: Needs assistance Sitting-balance support: Feet supported;No upper extremity supported Sitting balance-Leahy Scale: Fair     Standing balance support: During functional activity;Single extremity supported Standing balance-Leahy Scale: Fair Standing balance comment: Able to stand and urinate without UE support, but furniture walks in roomf or support.                             Pertinent Vitals/Pain Pain Assessment: No/denies pain    Home Living Family/patient expects to be discharged to:: Private residence Living Arrangements: Children (with daughter) Available Help at Discharge: Family;Available PRN/intermittently Type of Home: House       Home Layout: One level Home Equipment: Walker - 2 wheels;Cane - single point      Prior Function Level of Independence: Independent with assistive device(s)         Comments: Uses RW vs SPC for ambulation. Daughter does IADls. Reports no falls in last 6 months. Per MD note, pt recently moved from [place]     Hand Dominance        Extremity/Trunk Assessment   Upper Extremity Assessment Upper Extremity Assessment: Defer to OT evaluation  Lower Extremity Assessment Lower Extremity Assessment: Generalized weakness    Cervical / Trunk Assessment Cervical / Trunk Assessment: Kyphotic  Communication   Communication: HOH  Cognition Arousal/Alertness: Awake/alert Behavior During Therapy: WFL for tasks assessed/performed Overall Cognitive  Status: Impaired/Different from baseline Area of Impairment: Orientation Orientation Level: Disoriented to;Time ("November")                  General Comments      Exercises     Assessment/Plan    PT Assessment Patient needs continued PT services  PT Problem List Decreased strength;Decreased mobility;Decreased safety awareness;Decreased activity tolerance;Decreased balance;Decreased cognition;Cardiopulmonary status limiting activity       PT Treatment Interventions Therapeutic activities;Gait training;Therapeutic exercise;Stair training;Balance training;Neuromuscular re-education;Functional mobility training;Patient/family education;Cognitive remediation    PT Goals (Current goals can be found in the Care Plan section)  Acute Rehab PT Goals Patient Stated Goal: to rest PT Goal Formulation: With patient Time For Goal Achievement: 10/20/16 Potential to Achieve Goals: Good    Frequency Min 3X/week   Barriers to discharge Decreased caregiver support daughter is in and out for a lot of day    Co-evaluation               End of Session Equipment Utilized During Treatment: Gait belt Activity Tolerance: Patient tolerated treatment well Patient left: in bed;with call bell/phone within reach;with bed alarm set Nurse Communication: Mobility status PT Visit Diagnosis: Unsteadiness on feet (R26.81);Muscle weakness (generalized) (M62.81)    Functional Assessment Tool Used: Clinical judgement Functional Limitation: Mobility: Walking and moving around Mobility: Walking and Moving Around Current Status ([id number](G8978): At least 1 percent but less than 20 percent impaired, limited or restricted Mobility: Walking and Moving Around Goal Status [phone](G8979): At least 1 percent but less than 20 percent impaired, limited or restricted    Time: 1435-1502 PT Time Calculation (min) (ACUTE ONLY): 27 min   Charges:   PT Evaluation $PT Eval Low Complexity: 1 Procedure PT Treatments $Gait  Training: 8-22 mins   PT G Codes:   PT G-Codes **NOT FOR INPATIENT CLASS** Functional Assessment Tool Used: Clinical judgement Functional Limitation: Mobility: Walking and moving around Mobility: Walking and Moving Around Current Status (U9811(G8978): At least 1 percent but less than 20 percent impaired, limited or restricted Mobility: Walking and Moving Around Goal Status [phone](G8979): At least 1 percent but less than 20 percent impaired, limited or restricted     Shakerra Red A Afsa Meany 10/06/2016, 3:11 PM Mylo RedShauna Kathyann Spaugh, PT, DPT [phone]

## 2016-10-06 NOTE — Progress Notes (Signed)
   10/06/16 0145  Vitals  Temp 97.6 F (36.4 C)  Temp Source Oral  BP 125/76  BP Location Right Arm  Patient Position (if appropriate) Lying  Pulse Rate 85  Pulse Rate Source Dinamap  Oxygen Therapy  SpO2 99 %  O2 Device Room Air   Pt arrived to unit. Placed and verified on telemetry. Vital signs WNL. Pt oriented to room. Call bell in place. Will continue to monitor.  Sandrea HammondJunris Avrom Robarts RN

## 2016-10-06 NOTE — Care Management Note (Addendum)
Case Management Note  Patient Details  Name: Darrell Glass MRN: [id number] Date of Birth: [birth year]  Subjective/Objective:                 Spke with patient and daughter about DC planning needs. [place] [facility] [place] [place] [name] assisting with transition. Darrell Glass provided insurance information and name of DME O2 supplier. Patient has copy of Medicare A&B in Epic documents, and [facility] listed as well. CM spoke with rep at [employer] [phone]) who confirmed patient had picked up concentrator for night time oxygen use only. They could not provide dose. They are a local company and cannot supply to [facility]. Patient will need qualifying oxygen sats, order for home O2, and referral placed if DC plan includes home oxygen. Daughter would like assistance with obtaining PCP. PCP apt with [facility] made and entered in AVS   Action/Plan:   Expected Discharge Date:                  Expected Discharge Plan:  Home w Home Health Services  In-House Referral:     Discharge planning Services  CM Consult  Post Acute Care Choice:    Choice offered to:  Adult Children, Patient  DME Arranged:    DME Agency:     HH Arranged:    HH Agency:     Status of Service:  In process, will continue to follow  If discussed at Long Length of Stay Meetings, dates discussed:    Additional Comments:  Darrell SabalDebbie Kennis Wissmann, RN 10/06/2016, 4:09 PM

## 2016-10-06 NOTE — Progress Notes (Signed)
Spoke with the patient and her daughter, patient has a living will in place which states he is DNR. I will change the order in the computer. Daughter to bring in the [facility].

## 2016-10-06 NOTE — Progress Notes (Signed)
PROGRESS NOTE    Darrell Glass  ZOX:[id number] DOB: December 01, 1932 DOA: 10/05/2016 PCP: No PCP Per Patient    Brief Narrative:  83 yo male with history of ischemic cardiomyopathy presents with worsening dyspnea and lower extremity edema. On the initial examination with positive JVD and pitting edema.  Admitted for diuresis and further evaluation. On home 02 that needs to be arranged.    Assessment & Plan:   Principal Problem:   Acute CHF (congestive heart failure) (HCC) Active Problems:   COPD (chronic obstructive pulmonary disease) (HCC)   CAD in native artery   Thrombocytopenia (HCC)   Normocytic anemia   CKD (chronic kidney disease)   Acute on chronic congestive heart failure (HCC)  1. Acute on chronic suspected systolic heart failure decompensation. Will continue diuresis with IV furosemide, target negative fluid balance, will follow on echocardiogram. Patient on aldactone and AICD in place, suggesting left ventricle ejection fraction less than 35 %. Chest film personally reviewed noted no significant infiltrates. BNP on admission greater than 4,500.   2. Ischemic cardiomyopathy. Continue asa, statin, carvedilol and low dose lisinopril. Continue furosemide and aldactone for diuresis. EKG personally reviewed noted 1st degree av block with right bundle branch block and lateral t wave inversions.   3. COPD with chronic hypoxic respiratory failure. Will continue oxymetry and supplemental 02 per [place]. No signs of exacerbation. Will need social services to resume home 02.   4. 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 Renal function with cr at 1.76 with K at 3,3 and serum bicarbonate at 28. Will continue diuresis with furosemide, urine output 1100 ml since admission.   5. Anemia and thrombocytopenia. Iron panel with serum iron 34 and ferritin 49 with transferrin saturation of 10%, will start patient on iron supplements, further workup as outpatient. Follow on platelets. No signs of bleeding. Risk vs benefit will continue  on asa for now.    DVT prophylaxis: heparin  Code Status: full  Family Communication: No family at bedside  Disposition Plan: home    Consultants:     Procedures:   Antimicrobials:    Subjective: Positive for dyspnea and lower extremity edema, no chest pain. No cough. No fever or chills, no nausea or vomiting. Patient unable to get home 02, recent move from [place]. Questionable compliance with medications.   Objective: Vitals:   10/06/16 0146 10/06/16 0150 10/06/16 0809 10/06/16 1042  BP:   100/62 116/61  Pulse:   71   Resp:      Temp:      TempSrc:      SpO2:  100%    Weight: 66.4 kg (146 lb 4.8 oz)     Height: 5\' 8"  (1.727 m)       Intake/Output Summary (Last 24 hours) at 10/06/16 1119 Last data filed at 10/06/16 1042  Gross per 24 hour  Intake              120 ml  Output             1900 ml  Net            -1780 ml   Filed Weights   10/05/16 1713 10/06/16 0146  Weight: 63.5 kg (140 lb) 66.4 kg (146 lb 4.8 oz)    Examination:  General exam: deconditioned E ENT: no pallor or icterus Respiratory system: Mild decreased breath sounds at bases. No wheezing, rales or rhonchi.  Respiratory effort normal. Cardiovascular system: S1 & S2 heard, RRR. Positive severe JVD, positive apical murmurs  3/6 radiated to the axilla, systolic, rubs, gallops or clicks. ++ pitting edema. Gastrointestinal system: Abdomen is nondistended, soft and nontender. No organomegaly or masses felt. Normal bowel sounds heard. Central nervous system: Alert and oriented. No focal neurological deficits. Extremities: Symmetric 5 x 5 power. Skin: No rashes, lesions or ulcers   Data Reviewed: I have personally reviewed following labs and imaging studies  CBC:  Recent Labs Lab 10/05/16 1718 10/06/16 0757  WBC 5.6 4.9  NEUTROABS 3.8 3.0  HGB 11.0* 9.3*  HCT 35.7* 30.2*  MCV 86.2 85.8  PLT 92* 79*   Basic Metabolic Panel:  Recent Labs Lab 10/05/16 1718 10/06/16 0216 10/06/16 0757  NA  140  --  137  K 4.6  --  3.3*  CL 103  --  102  CO2 29  --  28  GLUCOSE 141*  --  169*  BUN 22*  --  23*  CREATININE 1.68*  --  1.76*  CALCIUM 10.0  --  9.1  MG  --  1.9  --    GFR: Estimated Creatinine Clearance: 30.4 mL/min (by C-G formula based on SCr of 1.76 mg/dL (H)). Liver Function Tests:  Recent Labs Lab 10/06/16 0757  AST 31  ALT 36  ALKPHOS 59  BILITOT 1.9*  PROT 5.9*  ALBUMIN 2.8*   No results for input(s): LIPASE, AMYLASE in the last 168 hours. No results for input(s): AMMONIA in the last 168 hours. Coagulation Profile: No results for input(s): INR, PROTIME in the last 168 hours. Cardiac Enzymes:  Recent Labs Lab 10/06/16 0216 10/06/16 0757  TROPONINI 0.05* 0.06*   BNP (last 3 results) No results for input(s): PROBNP in the last 8760 hours. HbA1C: No results for input(s): HGBA1C in the last 72 hours. CBG: No results for input(s): GLUCAP in the last 168 hours. Lipid Profile: No results for input(s): CHOL, HDL, LDLCALC, TRIG, CHOLHDL, LDLDIRECT in the last 72 hours. Thyroid Function Tests:  Recent Labs  10/06/16 0216  TSH 1.529   Anemia Panel:  Recent Labs  10/06/16 0216  VITAMINB12 574  FOLATE 35.6  FERRITIN 49  TIBC 349  IRON 34*  RETICCTPCT 1.1   Sepsis Labs: No results for input(s): PROCALCITON, LATICACIDVEN in the last 168 hours.  No results found for this or any previous visit (from the past 240 hour(s)).       Radiology Studies: Dg Chest 2 View  Result Date: 10/05/2016 CLINICAL DATA:  Chronic shortness of breath. EXAM: CHEST  2 VIEW COMPARISON:  None. FINDINGS: Permanent left-sided pacemaker with pacer wires in good position without complicating features. Surgical changes from bypass surgery. The heart is enlarged. There is tortuosity, ectasia and calcification of the thoracic aorta. The lungs are grossly clear. No infiltrates or effusions. Chronic appearing bronchitic changes. The upper abdominal bowel gas pattern is  unremarkable. The bony structures are intact. IMPRESSION: Cardiac enlargement but no acute pulmonary findings. Electronically Signed   By: Rudie MeyerP.  Gallerani M.D.   On: 10/05/2016 17:52        Scheduled Meds: . aspirin EC  81 mg Oral Daily  . atorvastatin  40 mg Oral q1800  . carvedilol  12.5 mg Oral BID WC  . docusate sodium  100 mg Oral BID  . famotidine  20 mg Oral BID  . finasteride  5 mg Oral QPM  . fluticasone furoate-vilanterol  1 puff Inhalation Daily  . furosemide  40 mg Intravenous Q12H  . lisinopril  2.5 mg Oral Daily  . psyllium  1 packet Oral Daily  . spironolactone  25 mg Oral Daily  . tamsulosin  0.4 mg Oral QPC supper   Continuous Infusions:   LOS: 0 days       Mauricio Annett Gula, MD [facility] Pager [phone]  If 7PM-7AM, please contact night-coverage [email or web address] Password [email or web address] 10/06/2016, 11:19 AM

## 2016-10-07 DIAGNOSIS — J42 Unspecified chronic bronchitis: Secondary | ICD-10-CM

## 2016-10-07 DIAGNOSIS — I5021 Acute systolic (congestive) heart failure: Secondary | ICD-10-CM

## 2016-10-07 DIAGNOSIS — D509 Iron deficiency anemia, unspecified: Secondary | ICD-10-CM

## 2016-10-07 DIAGNOSIS — N183 Chronic kidney disease, stage 3 (moderate): Secondary | ICD-10-CM

## 2016-10-07 DIAGNOSIS — D696 Thrombocytopenia, unspecified: Secondary | ICD-10-CM

## 2016-10-07 LAB — CBC WITH DIFFERENTIAL/PLATELET
BASOS PCT: 0 %
Basophils Absolute: 0 10*3/uL (ref 0.0–0.1)
EOS ABS: 0.1 10*3/uL (ref 0.0–0.7)
EOS PCT: 1 %
HCT: 31 % — ABNORMAL LOW (ref 39.0–52.0)
Hemoglobin: 9.7 g/dL — ABNORMAL LOW (ref 13.0–17.0)
Lymphocytes Relative: 23 %
Lymphs Abs: 1.2 10*3/uL (ref 0.7–4.0)
MCH: 27.2 pg (ref 26.0–34.0)
MCHC: 31.3 g/dL (ref 30.0–36.0)
MCV: 86.8 fL (ref 78.0–100.0)
MONOS PCT: 9 %
Monocytes Absolute: 0.5 10*3/uL (ref 0.1–1.0)
Neutro Abs: 3.5 10*3/uL (ref 1.7–7.7)
Neutrophils Relative %: 67 %
Platelets: 84 10*3/uL — ABNORMAL LOW (ref 150–400)
RBC: 3.57 MIL/uL — ABNORMAL LOW (ref 4.22–5.81)
RDW: 16.8 % — AB (ref 11.5–15.5)
WBC: 5.3 10*3/uL (ref 4.0–10.5)

## 2016-10-07 LAB — BASIC METABOLIC PANEL
Anion gap: 10 (ref 5–15)
BUN: 21 mg/dL — AB (ref 6–20)
CALCIUM: 9.1 mg/dL (ref 8.9–10.3)
CO2: 30 mmol/L (ref 22–32)
CREATININE: 1.85 mg/dL — AB (ref 0.61–1.24)
Chloride: 98 mmol/L — ABNORMAL LOW (ref 101–111)
GFR calc non Af Amer: 32 mL/min — ABNORMAL LOW (ref >60)
GFR, EST AFRICAN AMERICAN: 37 mL/min — AB (ref >60)
Glucose, Bld: 141 mg/dL — ABNORMAL HIGH (ref 65–99)
Potassium: 3.4 mmol/L — ABNORMAL LOW (ref 3.5–5.1)
SODIUM: 138 mmol/L (ref 135–145)

## 2016-10-07 MED ORDER — FUROSEMIDE 10 MG/ML IJ SOLN
40.0000 mg | Freq: Every day | INTRAMUSCULAR | Status: DC
  Administered 2016-10-08: 40 mg via INTRAVENOUS
  Filled 2016-10-07: qty 4

## 2016-10-07 NOTE — Progress Notes (Signed)
SATURATION QUALIFICATIONS: (This note is used to comply with regulatory documentation for home oxygen)  Patient Saturations on Room Air at Rest = 94%  Patient Saturations on Room Air while Ambulating = 86%  Patient Saturations on 2 Liters of oxygen while Ambulating = 97%  Please briefly explain why patient needs home oxygen: Pt desaturating on RA with gait Delaney MeigsMaija Tabor [place], [place] [phone]

## 2016-10-07 NOTE — Progress Notes (Addendum)
Spoke with Dr. Ella JubileeArrien, [facility]  Also notified daughter requesting call.

## 2016-10-07 NOTE — Progress Notes (Signed)
PROGRESS NOTE    Darrell Glass  ZOX:[id number] DOB: 1932/10/06 DOA: 10/05/2016 PCP: No PCP Per Patient    Brief Narrative:  83 yo male with history of ischemic cardiomyopathy presents with worsening dyspnea and lower extremity edema. On the initial examination with positive JVD and pitting edema.  Admitted for diuresis and further evaluation. On home 02 that needs to be arranged.    Assessment & Plan:   Principal Problem:   Acute CHF (congestive heart failure) (HCC) Active Problems:   COPD (chronic obstructive pulmonary disease) (HCC)   CAD in native artery   Thrombocytopenia (HCC)   Normocytic anemia   CKD (chronic kidney disease)   Acute on chronic congestive heart failure (HCC)   CHF (congestive heart failure) (HCC)   1. Acute on chronic suspected systolic heart failure decompensation. Continue diuresis with IV furosemide, urine output 2590 over last 24 hours, echocardiogram with reduced LV systolic function down to 25%, will continue metoprolol and lisinopril, along with aldactone. Noted persistent JVD on the right, suspected elevated pressure on superior vena cava due to possible hepatopathy due to heart failure.   2. Ischemic cardiomyopathy. Patient continue medical management with asa, statin, carvedilol and  Lisinopril. No signs of active ischemia.   3. COPD with chronic hypoxic respiratory failure. Continue oxymetry and supplemental 02 per [place]. No signs of exacerbation. Will arrange for home 02.   4. 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 Worsening renal function with cr at 1,85, will reduce dose of furosemide and follow renal pane,in a m, K  at 3,4 and serum bicarbonate at 30. Will plan to transition to oral diuretics at discharge.   5. Anemia and thrombocytopenia. Tolerating well iron supplements. Platelets 84.   DVT prophylaxis: heparin  Code Status: full  Family Communication: No family at bedside  Disposition Plan: home    Consultants:     Procedures:    Antimicrobials   Subjective: Dyspnea and edema still present but decreased intensity, no nausea or vomiting, out of bed to the chair.   Objective: Vitals:   10/07/16 0601 10/07/16 0758 10/07/16 1239 10/07/16 1255  BP: (!) 107/54  (!) 109/53   Pulse: 80  80 86  Resp: 18  18   Temp: (!) 100.5 F (38.1 C)  99.8 F (37.7 C)   TempSrc: Oral  Oral   SpO2: 97% 98% 100% 94%  Weight: 64.3 kg (141 lb 12.8 oz)     Height:        Intake/Output Summary (Last 24 hours) at 10/07/16 1336 Last data filed at 10/07/16 0819  Gross per 24 hour  Intake              240 ml  Output             1300 ml  Net            -1060 ml   Filed Weights   10/05/16 1713 10/06/16 0146 10/07/16 0601  Weight: 63.5 kg (140 lb) 66.4 kg (146 lb 4.8 oz) 64.3 kg (141 lb 12.8 oz)    Examination:  General exam: deconditioned. E ENT: no pallor or icterus.  Respiratory system: Clear to auscultation. Respiratory effort normal. Mild decreased breath sounds at bases.  Cardiovascular system: S1 & S2 heard, RRR. No JVD, murmurs, rubs, gallops or clicks. Pitting edema + lower extremities. Gastrointestinal system: Abdomen is nondistended, soft and nontender. No organomegaly or masses felt. Normal bowel sounds heard. Central nervous system: Alert and oriented. No focal neurological deficits. Extremities: Symmetric  5 x 5 power. Skin: No rashes, lesions or ulcers  Data Reviewed: I have personally reviewed following labs and imaging studies  CBC:  Recent Labs Lab 10/05/16 1718 10/06/16 0757 10/07/16 0254  WBC 5.6 4.9 5.3  NEUTROABS 3.8 3.0 3.5  HGB 11.0* 9.3* 9.7*  HCT 35.7* 30.2* 31.0*  MCV 86.2 85.8 86.8  PLT 92* 79* 84*   Basic Metabolic Panel:  Recent Labs Lab 10/05/16 1718 10/06/16 0216 10/06/16 0757 10/07/16 0254  NA 140  --  137 138  K 4.6  --  3.3* 3.4*  CL 103  --  102 98*  CO2 29  --  28 30  GLUCOSE 141*  --  169* 141*  BUN 22*  --  23* 21*  CREATININE 1.68*  --  1.76* 1.85*  CALCIUM  10.0  --  9.1 9.1  MG  --  1.9  --   --    GFR: Estimated Creatinine Clearance: 28 mL/min (by C-G formula based on SCr of 1.85 mg/dL (H)). Liver Function Tests:  Recent Labs Lab 10/06/16 0757  AST 31  ALT 36  ALKPHOS 59  BILITOT 1.9*  PROT 5.9*  ALBUMIN 2.8*   No results for input(s): LIPASE, AMYLASE in the last 168 hours. No results for input(s): AMMONIA in the last 168 hours. Coagulation Profile: No results for input(s): INR, PROTIME in the last 168 hours. Cardiac Enzymes:  Recent Labs Lab 10/06/16 0216 10/06/16 0757 10/06/16 1446  TROPONINI 0.05* 0.06* 0.05*   BNP (last 3 results) No results for input(s): PROBNP in the last 8760 hours. HbA1C: No results for input(s): HGBA1C in the last 72 hours. CBG: No results for input(s): GLUCAP in the last 168 hours. Lipid Profile: No results for input(s): CHOL, HDL, LDLCALC, TRIG, CHOLHDL, LDLDIRECT in the last 72 hours. Thyroid Function Tests:  Recent Labs  10/06/16 0216  TSH 1.529   Anemia Panel:  Recent Labs  10/06/16 0216  VITAMINB12 574  FOLATE 35.6  FERRITIN 49  TIBC 349  IRON 34*  RETICCTPCT 1.1   Sepsis Labs: No results for input(s): PROCALCITON, LATICACIDVEN in the last 168 hours.  No results found for this or any previous visit (from the past 240 hour(s)).       Radiology Studies: Dg Chest 2 View  Result Date: 10/05/2016 CLINICAL DATA:  Chronic shortness of breath. EXAM: CHEST  2 VIEW COMPARISON:  None. FINDINGS: Permanent left-sided pacemaker with pacer wires in good position without complicating features. Surgical changes from bypass surgery. The heart is enlarged. There is tortuosity, ectasia and calcification of the thoracic aorta. The lungs are grossly clear. No infiltrates or effusions. Chronic appearing bronchitic changes. The upper abdominal bowel gas pattern is unremarkable. The bony structures are intact. IMPRESSION: Cardiac enlargement but no acute pulmonary findings. Electronically  Signed   By: Rudie MeyerP.  Gallerani M.D.   On: 10/05/2016 17:52        Scheduled Meds: . aspirin EC  81 mg Oral Daily  . atorvastatin  40 mg Oral q1800  . carvedilol  12.5 mg Oral BID WC  . docusate sodium  100 mg Oral BID  . famotidine  20 mg Oral BID  . finasteride  5 mg Oral QPM  . fluticasone furoate-vilanterol  1 puff Inhalation Daily  . [START ON 10/08/2016] furosemide  40 mg Intravenous Daily  . lisinopril  2.5 mg Oral Daily  . psyllium  1 packet Oral Daily  . spironolactone  25 mg Oral Daily  . tamsulosin  0.4 mg Oral QPC supper   Continuous Infusions:   LOS: 1 day      Darrell Glass Annett Gula, MD [facility] Pager [phone]  If 7PM-7AM, please contact night-coverage [email or web address] Password [email or web address] 10/07/2016, 1:36 PM

## 2016-10-07 NOTE — Progress Notes (Signed)
  Physical Therapy Treatment Patient Details Name: Darrell Glass MRN: [id number] DOB: 02/22/1933 Today's Date: 10/07/2016    History of Present Illness Patient is a 83 y/o male with hx of pacemaker, CAD, COPD, CHF presents with SOB and swelling BLEs with JVD elevation. Elevated BNP. CXR-cardiomegaly but no acute findings. Found to have CHF exacerbation.    PT Comments    Pt very pleasant and moving well. Pt benefits from encouragement to maximize gait and distance. Pt educated for need for O2, pursed lip breathing and HEP.   HR 86 sats 86 on RA with gait 94-97% on 2L with gait   Follow Up Recommendations  No PT follow up;Supervision for mobility/OOB     Equipment Recommendations       Recommendations for Other Services       Precautions / Restrictions Precautions Precautions: Fall    Mobility  Bed Mobility Overal bed mobility: Modified Independent                Transfers Overall transfer level: Modified independent                  Ambulation/Gait Ambulation/Gait assistance: Supervision Ambulation Distance (Feet): 225 Feet Assistive device: Rolling walker (2 wheeled) Gait Pattern/deviations: Step-through pattern;Decreased stride length   Gait velocity interpretation: Below normal speed for age/gender General Gait Details: slow steady gait with cues for posture and directional cues. Pt with sats 97% on 2L with gait   Stairs            Wheelchair Mobility    Modified Rankin (Stroke Patients Only)       Balance     Sitting balance-Leahy Scale: Good       Standing balance-Leahy Scale: Fair                      Cognition Arousal/Alertness: Awake/alert Behavior During Therapy: WFL for tasks assessed/performed Overall Cognitive Status: Within Functional Limits for tasks assessed                      Exercises General Exercises - Lower Extremity Long Arc Quad: AROM;Both;Seated;15 reps Hip Flexion/Marching:  AROM;Both;Seated;15 reps    General Comments        Pertinent Vitals/Pain Pain Assessment: No/denies pain    Home Living                      Prior Function            PT Goals (current goals can now be found in the care plan section) Progress towards PT goals: Progressing toward goals    Frequency           PT Plan Current plan remains appropriate    Co-evaluation             End of Session Equipment Utilized During Treatment: Gait belt Activity Tolerance: Patient tolerated treatment well Patient left: in chair;with call bell/phone within reach;with family/visitor present Nurse Communication: Mobility status PT Visit Diagnosis: Muscle weakness (generalized) (M62.81)     Time: [id number] PT Time Calculation (min) (ACUTE ONLY): 20 min  Charges:  $Gait Training: 8-22 mins                    G Codes:       Ryott Rafferty B Raye Slyter 10/07/2016, 12:59 PM  Delaney MeigsMaija Tabor Candyce Gambino, PT [phone]

## 2016-10-08 DIAGNOSIS — I2510 Atherosclerotic heart disease of native coronary artery without angina pectoris: Secondary | ICD-10-CM

## 2016-10-08 LAB — BASIC METABOLIC PANEL
Anion gap: 10 (ref 5–15)
BUN: 21 mg/dL — ABNORMAL HIGH (ref 6–20)
CALCIUM: 8.9 mg/dL (ref 8.9–10.3)
CO2: 31 mmol/L (ref 22–32)
CREATININE: 1.82 mg/dL — AB (ref 0.61–1.24)
Chloride: 95 mmol/L — ABNORMAL LOW (ref 101–111)
GFR calc non Af Amer: 33 mL/min — ABNORMAL LOW (ref >60)
GFR, EST AFRICAN AMERICAN: 38 mL/min — AB (ref >60)
Glucose, Bld: 128 mg/dL — ABNORMAL HIGH (ref 65–99)
Potassium: 3.5 mmol/L (ref 3.5–5.1)
SODIUM: 136 mmol/L (ref 135–145)

## 2016-10-08 MED ORDER — LIVING BETTER WITH HEART FAILURE BOOK: Freq: Once | Status: DC

## 2016-10-08 MED ORDER — FUROSEMIDE 40 MG PO TABS: 40.0000 mg | ORAL_TABLET | ORAL | 0 refills | Status: DC

## 2016-10-08 NOTE — Discharge Summary (Signed)
Physician Discharge Summary  Darrell Glass ZOX:[id number] DOB: [birth year] DOA: 10/05/2016  PCP: No PCP Per Patient  Admit date: 10/05/2016 Discharge date: 10/08/2016  Admitted From:  Home  Disposition:  Home   Recommendations for Outpatient Follow-up:  1. Follow up with PCP in 1- week 2. Patient resumed on home oxygen 3. Heart failure home health services  Home Health: Heart failure  Equipment/Devices: Home 02  Discharge Condition: Stable  CODE STATUS: Full  Diet recommendation: Heart Healthy   Brief/Interim Summary: This is a 83 year old male who presented to hospital with a chief complaint of shortness of breath. He is known to have heart failure with ischemic cardiomyopathy, he recently moved from [place], unable to resume his supplemental home oxygen. Progressive dyspnea associated with lower extremity edema and orthopnea. On initial physical examination blood pressure 125/75, heart rate 91, respiratory rate 24, oxygen saturation 97% on supplemental oxygen. Positive JVD, bilateral rails follow auscultation, heart S1-S2 present rhythmic, positive murmur, systolic, 3/6, abdomen soft, positive extremity edema. Sodium 140, potassium 4.6, chloride 13, bicarbonate 29, glucose 141, BUN 22, creatinine 1.68, BNP 3 and 4500, troponin 0.04, Picklesimer count 5.6, Hemoglobin 11.0, hematocrit 35.7, platelets 92. EKG was sinus rhythm, positive LVH per EKG criteria, lateral T-wave inversions, right bundle branch block. Chest x-ray had cardiomegaly, defibrillator in place, no significant infiltrates.  The patient was admitted to the hospital working diagnosis of acute decompensated heart failure suspected systolic, but it by thrombocytopenia.  1. Acute on chronic systolic heart failure decompensation. The patient was admitted to the medical unit with a remote telemetry, patient was aggressively diuresed with intravenous furosemide, negative fluid balance was achieved. Since admission  -3620 mL. About 2 kg weight  loss with a discharged dry weight of 61 kg. Patient will continue ace inhibitor, beta blockade, Aldactone and furosemide. Heart failure teaching, home health services with heart failure care. Patient's daughter was instructed to give extra Lasix if signs of volume overload. Patient has a predominant right JVD concerned for congested hepatopathy.   2. Ischemic cardiomyopathy status post coronary bypass grafting. Patient will continue aspirin, statin, heart failure regimen with beta blockade and ACE inhibitor. No current chest pain or angina. Echocardiography with ejection fraction 20-25% left ventricle with diffuse hypokinesis, akinesis of the anteroseptal, anterior, inferior septal and myocardium.  3. COPD with chronic hypoxia or 3 failure. Patient was requalified for home oxygen, 2 L per nasal cannula of oxygen to use on exertion and while sleeping. Continue bronchodilator therapy   4. Chronic kidney disease stage 3-4. Discharge creatinine 1,82 with K at 3,5,. Will need a close follow-up on kidney function and electrolytes. Continue diuretic therapy.   5. Thrombocytopenia. Chronic and stable, platelets 79 to 84.    Discharge Diagnoses:  Principal Problem:   Acute CHF (congestive heart failure) (HCC) Active Problems:   COPD (chronic obstructive pulmonary disease) (HCC)   CAD in native artery   Thrombocytopenia (HCC)   Normocytic anemia   CKD (chronic kidney disease)   Acute on chronic congestive heart failure (HCC)   CHF (congestive heart failure) (HCC)    Discharge Instructions   Allergies as of 10/08/2016   No Known Allergies     Medication List    TAKE these medications   albuterol 108 (90 Base) MCG/ACT inhaler Commonly known as:  PROVENTIL HFA;VENTOLIN HFA Inhale 1-2 puffs into the lungs every 6 (six) hours as needed for wheezing or shortness of breath.   aspirin EC 81 MG tablet Take 81 mg by mouth daily.  atorvastatin 40 MG tablet Commonly known as:  LIPITOR Take 40  mg by mouth at bedtime.   BREO ELLIPTA 100-25 MCG/INH Aepb Generic drug:  fluticasone furoate-vilanterol Inhale 1 puff into the lungs every morning.   carvedilol 12.5 MG tablet Commonly known as:  COREG Take 12.5 mg by mouth 2 (two) times daily with a meal.   docusate sodium 100 MG capsule Commonly known as:  COLACE Take 100 mg by mouth 2 (two) times daily.   famotidine 20 MG tablet Commonly known as:  PEPCID Take 20 mg by mouth daily.   finasteride 5 MG tablet Commonly known as:  PROSCAR Take 5 mg by mouth every evening.   furosemide 40 MG tablet Commonly known as:  LASIX Take 1 tablet (40 mg total) by mouth every morning.   lisinopril 2.5 MG tablet Commonly known as:  PRINIVIL,ZESTRIL Take 2.5 mg by mouth every morning.   multivitamin with minerals Tabs tablet Take 1 tablet by mouth daily.   psyllium 95 % Pack Commonly known as:  HYDROCIL/METAMUCIL Take 1 packet by mouth daily.   spironolactone 25 MG tablet Commonly known as:  ALDACTONE Take 25 mg by mouth every morning.   tamsulosin 0.4 MG Caps capsule Commonly known as:  FLOMAX Take 0.4 mg by mouth daily after breakfast.            Durable Medical Equipment        Start     Ordered   10/08/16 0944  For home use only DME oxygen  Once    Question Answer Comment  Mode or (Route) Nasal cannula   Liters per Minute 2   Frequency Continuous (stationary and portable oxygen unit needed)   Oxygen conserving device Yes   Oxygen delivery system Gas      10/08/16 0944   10/08/16 0000  Heart failure home health orders  (Heart failure home health orders / Face to face)    Comments:  Heart Failure Follow-up Care:  Verify follow-up appointments per Patient Discharge Instructions. Confirm transportation arranged. Reconcile home medications with discharge medication list. Remove discontinued medications from use. Assist patient/caregiver to manage medications using pill box. Reinforce low sodium food  selection Assessments: Vital signs and oxygen saturation at each visit. Assess home environment for safety concerns, caregiver support and availability of low-sodium foods. Consult Child psychotherapistocial Worker, PT/OT, Dietitian, and CNA based on assessments. Perform comprehensive cardiopulmonary assessment. Notify MD for any change in condition or weight gain of 3 pounds in one day or 5 pounds in one week with symptoms. Daily Weights and Symptom Monitoring: Ensure patient has access to scales. Teach patient/caregiver to weigh daily before breakfast and after voiding using same scale and record.    Teach patient/caregiver to track weight and symptoms and when to notify Provider. Activity: Develop individualized activity plan with patient/caregiver.   Question Answer Comment  Heart Failure Follow-up Care [facility] at [phone]   Obtain the following labs Basic Metabolic Panel   Lab frequency Weekly   Fax lab results to [facility] at [phone]   Diet Low Sodium Heart Healthy   Fluid restrictions: 1200 mL Fluid   Skilled Nurse to notify MD of weight trends weekly for first 2 weeks. May fax or call: [facility] at [phone] (fax) or [phone]      10/08/16 1219     Follow-up Information    [facility] Go on 10/31/2016.   Specialty:  Internal Medicine Why:  at 2:15 with Dr Sherilyn BankerAsbury.  This appointment is to establish primary care. Contact information: [address] [place] [address] [phone]          No Known Allergies  Consultations:     Procedures/Studies: Dg Chest 2 View  Result Date: 10/05/2016 CLINICAL DATA:  Chronic shortness of breath. EXAM: CHEST  2 VIEW COMPARISON:  None. FINDINGS: Permanent left-sided pacemaker with pacer wires in good position without complicating features. Surgical changes from bypass surgery. The heart is enlarged. There is tortuosity, ectasia and calcification of the thoracic aorta. The lungs are grossly clear. No  infiltrates or effusions. Chronic appearing bronchitic changes. The upper abdominal bowel gas pattern is unremarkable. The bony structures are intact. IMPRESSION: Cardiac enlargement but no acute pulmonary findings. Electronically Signed   By: Rudie Meyer M.D.   On: 10/05/2016 17:52       Subjective: Patient feeling better, no dyspnea or chest pain, lower extremity edema have improved.   Discharge Exam: Vitals:   10/07/16 1950 10/08/16 0420  BP: (!) 98/50 (!) 113/56  Pulse: 75 81  Resp: 14 14  Temp: 99.8 F (37.7 C) 99.5 F (37.5 C)   Vitals:   10/07/16 1255 10/07/16 1950 10/08/16 0420 10/08/16 0900  BP:  (!) 98/50 (!) 113/56   Pulse: 86 75 81   Resp:  14 14   Temp:  99.8 F (37.7 C) 99.5 F (37.5 C)   TempSrc:  Oral Oral   SpO2: 94% 100% 100% 97%  Weight:   61.4 kg (135 lb 4.8 oz)   Height:        General: Pt is alert, awake, not in acute distress Cardiovascular: RRR, S1/S2 +, no rubs, no gallops Respiratory: CTA bilaterally, no wheezing, no rhonchi. Mild rales at bases Abdominal: Soft, NT, ND, bowel sounds + Extremities: no edema, no cyanosis    The results of significant diagnostics from this hospitalization (including imaging, microbiology, ancillary and laboratory) are listed below for reference.     Microbiology: No results found for this or any previous visit (from the past 240 hour(s)).   Labs: BNP (last 3 results)  Recent Labs  10/05/16 1718  BNP >4,500.0*   Basic Metabolic Panel:  Recent Labs Lab 10/05/16 1718 10/06/16 0216 10/06/16 0757 10/07/16 0254 10/08/16 0323  NA 140  --  137 138 136  K 4.6  --  3.3* 3.4* 3.5  CL 103  --  102 98* 95*  CO2 29  --  28 30 31   GLUCOSE 141*  --  169* 141* 128*  BUN 22*  --  23* 21* 21*  CREATININE 1.68*  --  1.76* 1.85* 1.82*  CALCIUM 10.0  --  9.1 9.1 8.9  MG  --  1.9  --   --   --    Liver Function Tests:  Recent Labs Lab 10/06/16 0757  AST 31  ALT 36  ALKPHOS 59  BILITOT 1.9*  PROT  5.9*  ALBUMIN 2.8*   No results for input(s): LIPASE, AMYLASE in the last 168 hours. No results for input(s): AMMONIA in the last 168 hours. CBC:  Recent Labs Lab 10/05/16 1718 10/06/16 0757 10/07/16 0254  WBC 5.6 4.9 5.3  NEUTROABS 3.8 3.0 3.5  HGB 11.0* 9.3* 9.7*  HCT 35.7* 30.2* 31.0*  MCV 86.2 85.8 86.8  PLT 92* 79* 84*   Cardiac Enzymes:  Recent Labs Lab 10/06/16 0216 10/06/16 0757 10/06/16 1446  TROPONINI 0.05* 0.06* 0.05*   BNP: Invalid input(s): POCBNP CBG: No results for input(s): GLUCAP in  the last 168 hours. D-Dimer No results for input(s): DDIMER in the last 72 hours. Hgb A1c No results for input(s): HGBA1C in the last 72 hours. Lipid Profile No results for input(s): CHOL, HDL, LDLCALC, TRIG, CHOLHDL, LDLDIRECT in the last 72 hours. Thyroid function studies  Recent Labs  10/06/16 0216  TSH 1.529   Anemia work up  Recent Labs  10/06/16 0216  VITAMINB12 574  FOLATE 35.6  FERRITIN 49  TIBC 349  IRON 34*  RETICCTPCT 1.1   Urinalysis    Component Value Date/Time   COLORURINE STRAW (A) 10/06/2016 0602   APPEARANCEUR CLEAR 10/06/2016 0602   LABSPEC 1.005 10/06/2016 0602   PHURINE 6.0 10/06/2016 0602   GLUCOSEU NEGATIVE 10/06/2016 0602   HGBUR NEGATIVE 10/06/2016 0602   BILIRUBINUR NEGATIVE 10/06/2016 0602   KETONESUR NEGATIVE 10/06/2016 0602   PROTEINUR NEGATIVE 10/06/2016 0602   NITRITE NEGATIVE 10/06/2016 0602   LEUKOCYTESUR NEGATIVE 10/06/2016 0602   Sepsis Labs Invalid input(s): PROCALCITONIN,  WBC,  LACTICIDVEN Microbiology No results found for this or any previous visit (from the past 240 hour(s)).   Time coordinating discharge: 45 minutes  SIGNED:   Coralie Keens, MD  [facility] 10/08/2016, 11:59 AM Pager   If 7PM-7AM, please contact night-coverage [email or web address] Password [email or web address]

## 2016-12-06 ENCOUNTER — Inpatient Hospital Stay (HOSPITAL_COMMUNITY)
Admission: EM | Admit: 2016-12-06 | Discharge: 2016-12-12 | DRG: 286 | Disposition: A | Discharge status: Home Health | Payer: PPO | Attending: Student in an Organized Health Care Education/Training Program | Admitting: Student in an Organized Health Care Education/Training Program

## 2016-12-06 ENCOUNTER — Emergency Department (HOSPITAL_COMMUNITY): Payer: PPO

## 2016-12-06 ENCOUNTER — Encounter (HOSPITAL_COMMUNITY): Payer: Self-pay | Admitting: Emergency Medicine

## 2016-12-06 DIAGNOSIS — I509 Heart failure, unspecified: Secondary | ICD-10-CM

## 2016-12-06 DIAGNOSIS — J441 Chronic obstructive pulmonary disease with (acute) exacerbation: Secondary | ICD-10-CM | POA: Diagnosis not present

## 2016-12-06 DIAGNOSIS — J9611 Chronic respiratory failure with hypoxia: Secondary | ICD-10-CM | POA: Diagnosis not present

## 2016-12-06 DIAGNOSIS — I130 Hypertensive heart and chronic kidney disease with heart failure and stage 1 through stage 4 chronic kidney disease, or unspecified chronic kidney disease: Secondary | ICD-10-CM | POA: Diagnosis not present

## 2016-12-06 DIAGNOSIS — N179 Acute kidney failure, unspecified: Secondary | ICD-10-CM | POA: Diagnosis present

## 2016-12-06 DIAGNOSIS — I5023 Acute on chronic systolic (congestive) heart failure: Secondary | ICD-10-CM

## 2016-12-06 DIAGNOSIS — E7800 Pure hypercholesterolemia, unspecified: Secondary | ICD-10-CM | POA: Diagnosis present

## 2016-12-06 DIAGNOSIS — I5043 Acute on chronic combined systolic (congestive) and diastolic (congestive) heart failure: Secondary | ICD-10-CM | POA: Diagnosis not present

## 2016-12-06 DIAGNOSIS — D61818 Other pancytopenia: Secondary | ICD-10-CM | POA: Diagnosis not present

## 2016-12-06 DIAGNOSIS — J42 Unspecified chronic bronchitis: Secondary | ICD-10-CM | POA: Diagnosis not present

## 2016-12-06 DIAGNOSIS — E785 Hyperlipidemia, unspecified: Secondary | ICD-10-CM | POA: Diagnosis not present

## 2016-12-06 DIAGNOSIS — D696 Thrombocytopenia, unspecified: Secondary | ICD-10-CM | POA: Diagnosis present

## 2016-12-06 DIAGNOSIS — Z66 Do not resuscitate: Secondary | ICD-10-CM | POA: Diagnosis present

## 2016-12-06 DIAGNOSIS — Z9981 Dependence on supplemental oxygen: Secondary | ICD-10-CM | POA: Diagnosis not present

## 2016-12-06 DIAGNOSIS — K5900 Constipation, unspecified: Secondary | ICD-10-CM | POA: Diagnosis not present

## 2016-12-06 DIAGNOSIS — N183 Chronic kidney disease, stage 3 (moderate): Secondary | ICD-10-CM | POA: Diagnosis present

## 2016-12-06 DIAGNOSIS — Z7951 Long term (current) use of inhaled steroids: Secondary | ICD-10-CM

## 2016-12-06 DIAGNOSIS — Z87891 Personal history of nicotine dependence: Secondary | ICD-10-CM | POA: Diagnosis not present

## 2016-12-06 DIAGNOSIS — I440 Atrioventricular block, first degree: Secondary | ICD-10-CM | POA: Diagnosis not present

## 2016-12-06 DIAGNOSIS — Z95810 Presence of automatic (implantable) cardiac defibrillator: Secondary | ICD-10-CM

## 2016-12-06 DIAGNOSIS — J449 Chronic obstructive pulmonary disease, unspecified: Secondary | ICD-10-CM | POA: Diagnosis present

## 2016-12-06 DIAGNOSIS — I252 Old myocardial infarction: Secondary | ICD-10-CM | POA: Diagnosis not present

## 2016-12-06 DIAGNOSIS — I272 Pulmonary hypertension, unspecified: Secondary | ICD-10-CM | POA: Diagnosis present

## 2016-12-06 DIAGNOSIS — Z8679 Personal history of other diseases of the circulatory system: Secondary | ICD-10-CM | POA: Diagnosis not present

## 2016-12-06 DIAGNOSIS — Z951 Presence of aortocoronary bypass graft: Secondary | ICD-10-CM | POA: Diagnosis not present

## 2016-12-06 DIAGNOSIS — Z79899 Other long term (current) drug therapy: Secondary | ICD-10-CM

## 2016-12-06 DIAGNOSIS — I2729 Other secondary pulmonary hypertension: Secondary | ICD-10-CM | POA: Diagnosis not present

## 2016-12-06 DIAGNOSIS — I080 Rheumatic disorders of both mitral and aortic valves: Secondary | ICD-10-CM | POA: Diagnosis present

## 2016-12-06 DIAGNOSIS — I110 Hypertensive heart disease with heart failure: Secondary | ICD-10-CM | POA: Diagnosis not present

## 2016-12-06 DIAGNOSIS — I2781 Cor pulmonale (chronic): Secondary | ICD-10-CM | POA: Diagnosis not present

## 2016-12-06 DIAGNOSIS — Z9114 Patient's other noncompliance with medication regimen: Secondary | ICD-10-CM

## 2016-12-06 DIAGNOSIS — I255 Ischemic cardiomyopathy: Secondary | ICD-10-CM | POA: Diagnosis present

## 2016-12-06 DIAGNOSIS — I2510 Atherosclerotic heart disease of native coronary artery without angina pectoris: Secondary | ICD-10-CM | POA: Diagnosis not present

## 2016-12-06 DIAGNOSIS — N189 Chronic kidney disease, unspecified: Secondary | ICD-10-CM | POA: Diagnosis present

## 2016-12-06 DIAGNOSIS — R0602 Shortness of breath: Secondary | ICD-10-CM

## 2016-12-06 LAB — HEPATIC FUNCTION PANEL
ALK PHOS: 105 U/L (ref 38–126)
ALT: 16 U/L — ABNORMAL LOW (ref 17–63)
AST: 26 U/L (ref 15–41)
Albumin: 3.5 g/dL (ref 3.5–5.0)
BILIRUBIN INDIRECT: 2 mg/dL — AB (ref 0.3–0.9)
Bilirubin, Direct: 0.5 mg/dL (ref 0.1–0.5)
TOTAL PROTEIN: 7.5 g/dL (ref 6.5–8.1)
Total Bilirubin: 2.5 mg/dL — ABNORMAL HIGH (ref 0.3–1.2)

## 2016-12-06 LAB — CBC
HCT: 32.1 % — ABNORMAL LOW (ref 39.0–52.0)
HEMOGLOBIN: 9.9 g/dL — AB (ref 13.0–17.0)
MCH: 27.3 pg (ref 26.0–34.0)
MCHC: 30.8 g/dL (ref 30.0–36.0)
MCV: 88.7 fL (ref 78.0–100.0)
PLATELETS: 67 10*3/uL — AB (ref 150–400)
RBC: 3.62 MIL/uL — AB (ref 4.22–5.81)
RDW: 19.3 % — ABNORMAL HIGH (ref 11.5–15.5)
WBC: 5.2 10*3/uL (ref 4.0–10.5)

## 2016-12-06 LAB — BASIC METABOLIC PANEL
ANION GAP: 6 (ref 5–15)
BUN: 19 mg/dL (ref 6–20)
CALCIUM: 9.9 mg/dL (ref 8.9–10.3)
CO2: 26 mmol/L (ref 22–32)
CREATININE: 2.11 mg/dL — AB (ref 0.61–1.24)
Chloride: 109 mmol/L (ref 101–111)
GFR, EST AFRICAN AMERICAN: 32 mL/min — AB (ref >60)
GFR, EST NON AFRICAN AMERICAN: 28 mL/min — AB (ref >60)
Glucose, Bld: 112 mg/dL — ABNORMAL HIGH (ref 65–99)
Potassium: 4.7 mmol/L (ref 3.5–5.1)
SODIUM: 141 mmol/L (ref 135–145)

## 2016-12-06 LAB — BRAIN NATRIURETIC PEPTIDE: B NATRIURETIC PEPTIDE 5: 4216.7 pg/mL — AB (ref 0.0–100.0)

## 2016-12-06 LAB — I-STAT TROPONIN, ED: TROPONIN I, POC: 0.03 ng/mL (ref 0.00–0.08)

## 2016-12-06 MED ORDER — ONDANSETRON HCL 4 MG/2ML IJ SOLN: 4.0000 mg | Freq: Four times a day (QID) | INTRAMUSCULAR | Status: DC | PRN

## 2016-12-06 MED ORDER — FUROSEMIDE 10 MG/ML IJ SOLN
40.0000 mg | Freq: Two times a day (BID) | INTRAMUSCULAR | Status: DC
  Administered 2016-12-07 – 2016-12-09 (×5): 40 mg via INTRAVENOUS
  Filled 2016-12-06 (×5): qty 4

## 2016-12-06 MED ORDER — METHYLPREDNISOLONE SODIUM SUCC 125 MG IJ SOLR
125.0000 mg | Freq: Once | INTRAMUSCULAR | Status: AC
  Administered 2016-12-06: 125 mg via INTRAVENOUS
  Filled 2016-12-06: qty 2

## 2016-12-06 MED ORDER — TAMSULOSIN HCL 0.4 MG PO CAPS
0.4000 mg | ORAL_CAPSULE | Freq: Every day | ORAL | Status: DC
  Administered 2016-12-07 – 2016-12-12 (×6): 0.4 mg via ORAL
  Filled 2016-12-06 (×6): qty 1

## 2016-12-06 MED ORDER — FUROSEMIDE 10 MG/ML IJ SOLN
40.0000 mg | Freq: Once | INTRAMUSCULAR | Status: AC
  Administered 2016-12-06: 40 mg via INTRAVENOUS
  Filled 2016-12-06: qty 4

## 2016-12-06 MED ORDER — FLUTICASONE FUROATE-VILANTEROL 100-25 MCG/INH IN AEPB
1.0000 | INHALATION_SPRAY | Freq: Every day | RESPIRATORY_TRACT | Status: DC
  Administered 2016-12-08 – 2016-12-12 (×5): 1 via RESPIRATORY_TRACT
  Filled 2016-12-06 (×2): qty 28

## 2016-12-06 MED ORDER — ENOXAPARIN SODIUM 30 MG/0.3ML ~~LOC~~ SOLN
30.0000 mg | SUBCUTANEOUS | Status: DC
  Administered 2016-12-06: 30 mg via SUBCUTANEOUS
  Filled 2016-12-06: qty 0.3

## 2016-12-06 MED ORDER — FINASTERIDE 5 MG PO TABS
5.0000 mg | ORAL_TABLET | Freq: Every evening | ORAL | Status: DC
  Administered 2016-12-06 – 2016-12-11 (×6): 5 mg via ORAL
  Filled 2016-12-06 (×6): qty 1

## 2016-12-06 MED ORDER — ATORVASTATIN CALCIUM 40 MG PO TABS
40.0000 mg | ORAL_TABLET | Freq: Every day | ORAL | Status: DC
Start: 2016-12-06 — End: 2016-12-12
  Administered 2016-12-06 – 2016-12-11 (×6): 40 mg via ORAL
  Filled 2016-12-06 (×6): qty 1

## 2016-12-06 MED ORDER — IPRATROPIUM-ALBUTEROL 0.5-2.5 (3) MG/3ML IN SOLN
3.0000 mL | Freq: Once | RESPIRATORY_TRACT | Status: AC
  Administered 2016-12-06: 3 mL via RESPIRATORY_TRACT
  Filled 2016-12-06: qty 3

## 2016-12-06 MED ORDER — FAMOTIDINE 20 MG PO TABS
20.0000 mg | ORAL_TABLET | Freq: Every day | ORAL | Status: DC
  Administered 2016-12-07 – 2016-12-12 (×6): 20 mg via ORAL
  Filled 2016-12-06 (×6): qty 1

## 2016-12-06 MED ORDER — CARVEDILOL 12.5 MG PO TABS
12.5000 mg | ORAL_TABLET | Freq: Two times a day (BID) | ORAL | Status: DC
  Administered 2016-12-07: 12.5 mg via ORAL
  Filled 2016-12-06: qty 1

## 2016-12-06 MED ORDER — IPRATROPIUM-ALBUTEROL 0.5-2.5 (3) MG/3ML IN SOLN: 3.0000 mL | Freq: Four times a day (QID) | RESPIRATORY_TRACT | Status: DC | PRN

## 2016-12-06 MED ORDER — SPIRONOLACTONE 25 MG PO TABS
25.0000 mg | ORAL_TABLET | Freq: Every day | ORAL | Status: DC
  Administered 2016-12-07: 25 mg via ORAL
  Filled 2016-12-06: qty 1

## 2016-12-06 MED ORDER — ONDANSETRON HCL 4 MG PO TABS: 4.0000 mg | ORAL_TABLET | Freq: Four times a day (QID) | ORAL | Status: DC | PRN

## 2016-12-06 NOTE — H&P (Signed)
Date: 12/06/2016               Patient Name:  Darrell Glass MRN: [id number]  DOB: 1933-05-04 Age / Sex: 83 y.o., male   PCP: No Pcp Per Patient         Medical Service: Internal Medicine Teaching Service         Attending Physician: Dr. Tyson Alias, MD    First Contact: Dr. Samuella Cota Pager: [phone]  Second Contact: Dr. Dimple Casey Pager: [phone]       After Hours (After 5p/  First Contact Pager: [phone]  weekends / holidays): Second Contact Pager: [phone]   Chief Complaint: shortness of breath  History of Present Illness:  Darrell Glass is an 83yo male with PMH of HTN, HLD, systolic heart failure, CAD s/p CABG, COPD on PRN and qhs 2L O2 at home, CKD stage 3, thrombocytopenia presenting to [facility] with one week history of exertional dyspnea, orthopnea, bendopnea, nonproductive cough, and bil LE edema. Patient states that he barely drinks liquids; he state his usual lunch is hamburger and a milkshake. He states that he has not been taking his pill labeled, "water", due to fear that it would make him retain more fluid; he also endorses inconsistent use of his carvedilol. He also endorses some wheezing that has improved after neb treatment in the ED. He denies chest tightness or pain, fevers, chills, nausea, vomiting, diarrhea, headache, vision or hearing changes, urinary symptoms. Patient lives with his daughter and is mainly sedentary.    Meds:  Current Meds  Medication Sig  . albuterol (PROVENTIL HFA;VENTOLIN HFA) 108 (90 Base) MCG/ACT inhaler Inhale 1-2 puffs into the lungs every 6 (six) hours as needed for wheezing or shortness of breath.  Marland Kitchen atorvastatin (LIPITOR) 40 MG tablet Take 40 mg by mouth at bedtime.  . carvedilol (COREG) 12.5 MG tablet Take 12.5 mg by mouth 2 (two) times daily with a meal.  . famotidine (PEPCID) 20 MG tablet Take 20 mg by mouth daily.  . finasteride (PROSCAR) 5 MG tablet Take 5 mg by mouth every evening.  . fluticasone furoate-vilanterol (BREO ELLIPTA) 100-25  MCG/INH AEPB Inhale 1 puff into the lungs every morning.  . naproxen sodium (ANAPROX) 220 MG tablet Take 440 mg by mouth 2 (two) times daily as needed (for pain).  Marland Kitchen spironolactone (ALDACTONE) 25 MG tablet Take 25 mg by mouth every morning.  . tamsulosin (FLOMAX) 0.4 MG CAPS capsule Take 0.4 mg by mouth daily after breakfast.   Allergies: Allergies as of 12/06/2016  . (No Known Allergies)   Past Medical History:  Diagnosis Date  . AICD (automatic cardioverter/defibrillator) present   . Arthritis    "small of my back" (10/06/2016)  . CHF (congestive heart failure) (HCC)   . Chronic kidney disease (CKD), stage III (moderate)    stage III or IV/notes 10/06/2016  . COPD (chronic obstructive pulmonary disease) (HCC)   . Coronary artery disease   . High cholesterol   . Hypertension   . Iron deficiency anemia    "takes iron pill" (10/06/2016)  . Myocardial infarction [facility])    "led to triple bypass & defibrillator"   . On home oxygen therapy    "just relocated this weekend from [place]; he was on O2 there; don't have any here" (10/06/2016)  . Pneumonia    "@ least twice" (10/06/2016)   Family History: Patient denies family history cardiac disease, strokes, cancers.  Social History: Patient endorses 160+ pack year smoking history -  he quit one year ago. Patient denies prior or current alcohol or illicit drug use.   Review of Systems: A complete ROS was negative except as per HPI.   Physical Exam: Blood pressure 126/75, pulse 78, temperature 97.4 F (36.3 C), temperature source Oral, resp. rate (!) 21, SpO2 100 %. General: alert, well-developed, and cooperative to examination.  Head: normocephalic and atraumatic.  Eyes: vision grossly intact, pupils equal, pupils round, pupils reactive to light, no injection and anicteric.  Mouth: pharynx pink and moist.  Neck: supple, full ROM, no thyromegaly, no JVD  Lungs: normal respiratory effort, no accessory muscle use; bilateral rales  Heart: normal  rate, regular rhythm, systolic ejection murmur best heard at RUSB, no gallop, and no rub; 2+ pitting bil LE edema.  Abdomen: soft, non-tender, normal bowel sounds, no distention, no guarding.  Msk: no joint swelling, no joint warmth, and no redness over joints; moving all extremities freely.  Pulses: 2+ DP/PT pulses bilaterally Extremities: No cyanosis, pitting edema bil LEs Neurologic: alert & oriented X3, cranial nerves grossly II-XII intact, strength normal in all extremities, sensation intact to light touch.  Skin: turgor normal and no rashes.  Psych: Oriented X3, memory intact for recent and remote, normally interactive, good eye contact, not anxious appearing, and not depressed appearing.  LABS Na 141, K 4.7, Cl 109, CO2 26, BUN 19, Cr 2.11 (previous 1.8), glu 112 Bilirubin 2.5 WBC 5.2, Hgb 9.9 (at b/l), Hct 32.1, Plts 67 BNP 4200 iStat trop neg  EKG: Personally reviewed - SR, 1st degree AV block, no acute ischemic changes compared to prior  CXR: Personally reviewed - ICD in place; cardiomegaly, mild congestion  Assessment & Plan by Problem: Active Problems:   Acute exacerbation of congestive heart failure (HCC)  Acute on chronic sCHF:  Patient with one week history of dyspnea on exertion, orthopnea, bendopnea, wet cough, and clinically volume overloaded in setting of known systolic heart failure (EF 20-25%), dietary indiscretion and missing doses of diuretics. Cr is slightly more elevated than at previous admission likely in conjunction with his overall clinical picture. At home on lasix  daily, spironolactone  daily, and carvedilol 12.5mg  BID. Discharge weight 61.5kg in March, currently 72kg. --lasix  BID --spironolactone  daily --carvedilol 12.5mg  BID --daily weights --strict ins/outs --fluid restricted diet --follow AM Bmet --discussed diet and fluid restricted diet; will continue addressing prior to discharge  ICM, s/p CABG, s/p ICD HLD HTN Patient on  carvedilol 12.5mg  BID, spironolactone  daily, atorvastatin  daily. --continue home meds  COPD, on home oxygen: Patient on home meds of Breo and Albuterol. He had some wheezing on initial EDP evaluation for which he received solumedrol and nebs - this has resolved by time of our evaluation. At this time, I think the driving factor to his dyspnea is heart failure exacerbation rather than COPD exacerbation due to his overall clinical picture. --s/p  solumedrol --Breo --duoneb PRN --Home O2 - 2L qhs and PRN with activity  Diet: HH, fluid restricted DVT ppx: lovenox Code: DNR  Dispo: Admit patient to Observation with expected length of stay less than 2 midnights.  Signed: Nyra Market, MD 12/06/2016, 7:41 PM  Pager [phone]

## 2016-12-06 NOTE — ED Provider Notes (Signed)
[facility]-EM[facility] DEPT Provider Note   CSN: [id number] Arrival date & time: 12/06/16  1154     History   Chief Complaint Chief Complaint  Patient presents with  . Shortness of Breath    HPI Darrell Glass is a 83 y.o. male.  The history is provided by the patient and a relative. No language interpreter was used.  Shortness of Breath  This is a recurrent problem. The average episode lasts 1 week. The problem occurs continuously.The current episode started more than 1 week ago. The problem has been gradually worsening. Associated symptoms include wheezing and leg swelling. Pertinent negatives include no fever, no headaches, no rhinorrhea, no sore throat, no neck pain, no cough, no sputum production, no hemoptysis, no chest pain, no syncope, no vomiting, no abdominal pain, no rash and no leg pain. He has tried nothing for the symptoms. The treatment provided no relief. He has had prior hospitalizations. He has had prior ED visits. Associated medical issues include COPD and heart failure.    Past Medical History:  Diagnosis Date  . AICD (automatic cardioverter/defibrillator) present   . Arthritis    "small of my back" (10/06/2016)  . CHF (congestive heart failure) (HCC)   . Chronic kidney disease (CKD), stage III (moderate)    stage III or IV/notes 10/06/2016  . COPD (chronic obstructive pulmonary disease) (HCC)   . Coronary artery disease   . High cholesterol   . Hypertension   . Iron deficiency anemia    "takes iron pill" (10/06/2016)  . Myocardial infarction [facility])    "led to triple bypass & defibrillator"   . On home oxygen therapy    "just relocated this weekend from [place]; he was on O2 there; don't have any here" (10/06/2016)  . Pneumonia    "@ least twice" (10/06/2016)    Patient Active Problem List   Diagnosis Date Noted  . Acute CHF (congestive heart failure) (HCC) 10/06/2016  . COPD (chronic obstructive pulmonary disease) (HCC) 10/06/2016  . CAD in native artery 10/06/2016  .  Thrombocytopenia (HCC) 10/06/2016  . Normocytic anemia 10/06/2016  . CKD (chronic kidney disease) 10/06/2016  . Acute on chronic congestive heart failure (HCC) 10/06/2016  . CHF (congestive heart failure) (HCC) 10/06/2016    Past Surgical History:  Procedure Laterality Date  . CARDIAC CATHETERIZATION    . CORONARY ARTERY BYPASS GRAFT     CABG X3  . INSERT / REPLACE / REMOVE PACEMAKER         Home Medications    Prior to Admission medications   Medication Sig Start Date End Date Taking? Authorizing Provider  albuterol (PROVENTIL HFA;VENTOLIN HFA) 108 (90 Base) MCG/ACT inhaler Inhale 1-2 puffs into the lungs every 6 (six) hours as needed for wheezing or shortness of breath.   Yes Historical Provider, MD  atorvastatin (LIPITOR) 40 MG tablet Take 40 mg by mouth at bedtime.   Yes Historical Provider, MD  carvedilol (COREG) 12.5 MG tablet Take 12.5 mg by mouth 2 (two) times daily with a meal.   Yes Historical Provider, MD  famotidine (PEPCID) 20 MG tablet Take 20 mg by mouth daily.   Yes Historical Provider, MD  finasteride (PROSCAR) 5 MG tablet Take 5 mg by mouth every evening.   Yes Historical Provider, MD  fluticasone furoate-vilanterol (BREO ELLIPTA) 100-25 MCG/INH AEPB Inhale 1 puff into the lungs every morning.   Yes Historical Provider, MD  naproxen sodium (ANAPROX) 220 MG tablet Take 440 mg by mouth 2 (two) times daily as  needed (for pain).   Yes Historical Provider, MD  spironolactone (ALDACTONE) 25 MG tablet Take 25 mg by mouth every morning.   Yes Historical Provider, MD  tamsulosin (FLOMAX) 0.4 MG CAPS capsule Take 0.4 mg by mouth daily after breakfast.   Yes Historical Provider, MD  furosemide (LASIX) 40 MG tablet Take 1 tablet (40 mg total) by mouth every morning. Patient not taking: Reported on 12/06/2016 10/08/16   Mauricio Annett Gula, MD    Family History Family History  Problem Relation Age of Onset  . CAD Neg Hx   . Diabetes Mellitus II Neg Hx     Social  History Social History  Substance Use Topics  . Smoking status: Former Smoker    Packs/day: 2.00    Years: 71.00    Types: Cigarettes  . Smokeless tobacco: Never Used     Comment: 10/06/2016 "stopped ~ 09/2015"  . Alcohol use No     Comment: 10/06/2016 "nothing for years and years"     Allergies   Patient has no known allergies.   Review of Systems Review of Systems  Constitutional: Positive for fatigue. Negative for chills, diaphoresis and fever.  HENT: Negative for congestion, rhinorrhea and sore throat.   Eyes: Negative for visual disturbance.  Respiratory: Positive for chest tightness, shortness of breath and wheezing. Negative for cough, hemoptysis, sputum production and stridor.   Cardiovascular: Positive for leg swelling. Negative for chest pain and syncope.  Gastrointestinal: Negative for abdominal pain, diarrhea, nausea and vomiting.  Genitourinary: Negative for dysuria and flank pain.  Musculoskeletal: Negative for back pain and neck pain.  Skin: Negative for rash and wound.  Neurological: Negative for dizziness, light-headedness, numbness and headaches.  Psychiatric/Behavioral: Negative for agitation.  All other systems reviewed and are negative.    Physical Exam Updated Vital Signs BP 112/65   Pulse 67   Temp 97.4 F (36.3 C) (Oral)   Resp (!) 26   SpO2 100%   Physical Exam  Constitutional: He appears well-developed and well-nourished.  HENT:  Head: Normocephalic and atraumatic.  Nose: Nose normal.  Mouth/Throat: Oropharynx is clear and moist. No oropharyngeal exudate.  Eyes: Conjunctivae and EOM are normal. Pupils are equal, round, and reactive to light.  Neck: Neck supple.  Cardiovascular: Normal rate, regular rhythm, normal heart sounds and intact distal pulses.   No murmur heard. Pulmonary/Chest: Tachypnea noted. No respiratory distress. He has no decreased breath sounds. He has wheezes. He has no rhonchi. He has rales. He exhibits no tenderness.   Abdominal: Soft. Bowel sounds are normal. He exhibits no distension. There is no tenderness.  Musculoskeletal: He exhibits no tenderness.       Right lower leg: He exhibits edema.       Left lower leg: He exhibits edema.  Neurological: He is alert. No sensory deficit. He exhibits normal muscle tone.  Skin: Skin is warm and dry. Capillary refill takes less than 2 seconds. No erythema. No pallor.  Psychiatric: He has a normal mood and affect.  Nursing note and vitals reviewed.    ED Treatments / Results  Labs (all labs ordered are listed, but only abnormal results are displayed) Labs Reviewed  BASIC METABOLIC PANEL - Abnormal; Notable for the following:       Result Value   Glucose, Bld 112 (*)    Creatinine, Ser 2.11 (*)    GFR calc non Af Amer 28 (*)    GFR calc Af Amer 32 (*)    All other components  within normal limits  CBC - Abnormal; Notable for the following:    RBC 3.62 (*)    Hemoglobin 9.9 (*)    HCT 32.1 (*)    RDW 19.3 (*)    Platelets 67 (*)    All other components within normal limits  BRAIN NATRIURETIC PEPTIDE - Abnormal; Notable for the following:    B Natriuretic Peptide 4,216.7 (*)    All other components within normal limits  HEPATIC FUNCTION PANEL - Abnormal; Notable for the following:    ALT 16 (*)    Total Bilirubin 2.5 (*)    Indirect Bilirubin 2.0 (*)    All other components within normal limits  URINALYSIS, ROUTINE W REFLEX MICROSCOPIC  I-STAT TROPOININ, ED    EKG  EKG Interpretation  Date/Time:  Tuesday Dec 06 2016 12:02:13 EDT Ventricular Rate:  70 PR Interval:  234 QRS Duration: 124 QT Interval:  434 QTC Calculation: 468 R Axis:   0 Text Interpretation:  Sinus rhythm with 1st degree A-V block Left ventricular hypertrophy with QRS widening ST & T wave abnormality, consider inferolateral ischemia Abnormal ECG when comapred to prior, no significant changes seen.  No STEMI Confirmed by Rush Landmark MD, Sherronda Sweigert [phone]) on 12/06/2016 3:10:54 PM        Radiology Dg Chest 2 View  Result Date: 12/06/2016 CLINICAL DATA:  Bronchitis for a few months with shortness of breath. EXAM: CHEST  2 VIEW COMPARISON:  October 05, 2016 FINDINGS: Stable cardiomegaly an AICD device. The hila and mediastinum are unchanged. No pneumothorax. No pulmonary nodules or masses. No focal infiltrate. IMPRESSION: No active cardiopulmonary disease. Electronically Signed   By: Gerome Sam III M.D   On: 12/06/2016 12:59    Procedures Procedures (including critical care time)  Medications Ordered in ED Medications  furosemide (LASIX) injection 40 mg (not administered)  ipratropium-albuterol (DUONEB) 0.5-2.5 (3) MG/3ML nebulizer solution 3 mL (3 mLs Nebulization Given 12/06/16 1421)  methylPREDNISolone sodium succinate (SOLU-MEDROL) 125 mg/2 mL injection 125 mg (125 mg Intravenous Given 12/06/16 1442)     Initial Impression / Assessment and Plan / ED Course  I have reviewed the triage vital signs and the nursing notes.  Pertinent labs & imaging results that were available during my care of the patient were reviewed by me and considered in my medical decision making (see chart for details).     Darrell Glass is a 83 y.o. male  with a past medical history significant for CAD status post CABG, COPD, CHF status post defibrillator, hypertension, and CK D who presents with shortness of breath and bilateral lower extremity edema 1 week. Patient is committed by family who reports that patient had had worsening shortness of breath and wheezing for 1 week. He denies any fevers, chills, chest pain, nausea, vomiting, urinary or GI symptoms. He reports that he intermittently takes his medications "when I feel like I need them." Family is concerned that he is picking and choosing which medicines he wants to take and not taking them as directed.  Patient reports that he is feeling very winded and cannot walk like he normally does. He is feeling short of breath and cannot catch his  breath. He continues to deny chest pain. Patient has swelling in both of his legs that continually worsening for the last week.  History and exam are seen above. On exam, patient has wheezing and crackles in both lungs. Patient's chest is nontender. No CVA tenderness. Abdomen nontender. Patient does have bilateral lower extremity pitting edema.  Patient also has some edema in his face which he reports has been worsening.  Patient given Solu-Medrol and DuoNeb for his wheezing. Wheezing improved. Suspect component of COPD exacerbation. Patient had laboratory testing and imaging. Although chest x-ray does not show significant fluid, clinically, suspect CHF exacerbation. BNP elevated at 4200. Troponin negative.  Patient given Lasix for CHF exacerbation.  Given patient's continued symptoms and his fluid overload, patient will be admitted for diuresis and further management. Patient will also need extensive teaching of medication use.   Patient admitted in stable condition to internal medicine service.    Final Clinical Impressions(s) / ED Diagnoses   Final diagnoses:  COPD exacerbation (HCC)  SOB (shortness of breath)  Acute on chronic congestive heart failure, unspecified heart failure type (HCC)    Clinical Impression: 1. COPD exacerbation (HCC)   2. SOB (shortness of breath)   3. Acute on chronic congestive heart failure, unspecified heart failure type [name])     Disposition: Admit to Internal medicine     Heide Scales, MD 12/06/16 2002

## 2016-12-06 NOTE — ED Triage Notes (Signed)
Pt sts increased SOB from norm since yesterday; pt labored at present

## 2016-12-06 NOTE — ED Notes (Signed)
Admit dr into see pt, pt laughing and talking with drs

## 2016-12-06 NOTE — ED Notes (Signed)
Pt and daughter reports increased SOB, coughing, and use of home O2 for the last week with increased swelling all extremities and face.  Pt reports itching due to the swelling and now has open sores from scratching on extremities and face.  Pt's daughter reports pt takes home medications as he chooses.  E.g. - Pt writes what the pills are for on the bottle, "heart/fluid" and doesn't take them because he "already has too much fluid and swelling" and doesn't want more.  This RN attempted to educate pt that he needs to take those pills as prescribed.  Pt needs additional education/counseling on medications.  Pt's daughter states pt doesn't know who or why most of his medications were prescribed so he doesn't want to take them.

## 2016-12-07 DIAGNOSIS — Z7951 Long term (current) use of inhaled steroids: Secondary | ICD-10-CM | POA: Diagnosis not present

## 2016-12-07 DIAGNOSIS — I252 Old myocardial infarction: Secondary | ICD-10-CM | POA: Diagnosis not present

## 2016-12-07 DIAGNOSIS — I440 Atrioventricular block, first degree: Secondary | ICD-10-CM | POA: Diagnosis not present

## 2016-12-07 DIAGNOSIS — I5023 Acute on chronic systolic (congestive) heart failure: Secondary | ICD-10-CM | POA: Diagnosis not present

## 2016-12-07 DIAGNOSIS — J441 Chronic obstructive pulmonary disease with (acute) exacerbation: Secondary | ICD-10-CM | POA: Diagnosis not present

## 2016-12-07 DIAGNOSIS — I2510 Atherosclerotic heart disease of native coronary artery without angina pectoris: Secondary | ICD-10-CM

## 2016-12-07 DIAGNOSIS — R0602 Shortness of breath: Secondary | ICD-10-CM | POA: Diagnosis not present

## 2016-12-07 DIAGNOSIS — Z9981 Dependence on supplemental oxygen: Secondary | ICD-10-CM | POA: Diagnosis not present

## 2016-12-07 DIAGNOSIS — I509 Heart failure, unspecified: Secondary | ICD-10-CM | POA: Diagnosis not present

## 2016-12-07 DIAGNOSIS — I272 Pulmonary hypertension, unspecified: Secondary | ICD-10-CM | POA: Diagnosis not present

## 2016-12-07 DIAGNOSIS — J42 Unspecified chronic bronchitis: Secondary | ICD-10-CM

## 2016-12-07 DIAGNOSIS — N189 Chronic kidney disease, unspecified: Secondary | ICD-10-CM | POA: Diagnosis not present

## 2016-12-07 DIAGNOSIS — J9611 Chronic respiratory failure with hypoxia: Secondary | ICD-10-CM | POA: Diagnosis not present

## 2016-12-07 DIAGNOSIS — N179 Acute kidney failure, unspecified: Secondary | ICD-10-CM | POA: Diagnosis not present

## 2016-12-07 DIAGNOSIS — J449 Chronic obstructive pulmonary disease, unspecified: Secondary | ICD-10-CM | POA: Diagnosis not present

## 2016-12-07 DIAGNOSIS — Z87891 Personal history of nicotine dependence: Secondary | ICD-10-CM | POA: Diagnosis not present

## 2016-12-07 DIAGNOSIS — Z66 Do not resuscitate: Secondary | ICD-10-CM

## 2016-12-07 DIAGNOSIS — Z8679 Personal history of other diseases of the circulatory system: Secondary | ICD-10-CM | POA: Diagnosis not present

## 2016-12-07 DIAGNOSIS — I2729 Other secondary pulmonary hypertension: Secondary | ICD-10-CM | POA: Diagnosis not present

## 2016-12-07 DIAGNOSIS — E785 Hyperlipidemia, unspecified: Secondary | ICD-10-CM | POA: Diagnosis not present

## 2016-12-07 DIAGNOSIS — I110 Hypertensive heart disease with heart failure: Secondary | ICD-10-CM | POA: Diagnosis not present

## 2016-12-07 DIAGNOSIS — Z95810 Presence of automatic (implantable) cardiac defibrillator: Secondary | ICD-10-CM | POA: Diagnosis not present

## 2016-12-07 DIAGNOSIS — I5043 Acute on chronic combined systolic (congestive) and diastolic (congestive) heart failure: Secondary | ICD-10-CM | POA: Diagnosis not present

## 2016-12-07 DIAGNOSIS — N183 Chronic kidney disease, stage 3 (moderate): Secondary | ICD-10-CM | POA: Diagnosis not present

## 2016-12-07 DIAGNOSIS — K5900 Constipation, unspecified: Secondary | ICD-10-CM | POA: Diagnosis not present

## 2016-12-07 DIAGNOSIS — I080 Rheumatic disorders of both mitral and aortic valves: Secondary | ICD-10-CM | POA: Diagnosis not present

## 2016-12-07 DIAGNOSIS — Z9114 Patient's other noncompliance with medication regimen: Secondary | ICD-10-CM | POA: Diagnosis not present

## 2016-12-07 DIAGNOSIS — I2781 Cor pulmonale (chronic): Secondary | ICD-10-CM | POA: Diagnosis not present

## 2016-12-07 DIAGNOSIS — I130 Hypertensive heart and chronic kidney disease with heart failure and stage 1 through stage 4 chronic kidney disease, or unspecified chronic kidney disease: Secondary | ICD-10-CM | POA: Diagnosis not present

## 2016-12-07 DIAGNOSIS — D61818 Other pancytopenia: Secondary | ICD-10-CM | POA: Diagnosis not present

## 2016-12-07 DIAGNOSIS — E7800 Pure hypercholesterolemia, unspecified: Secondary | ICD-10-CM | POA: Diagnosis not present

## 2016-12-07 DIAGNOSIS — I255 Ischemic cardiomyopathy: Secondary | ICD-10-CM

## 2016-12-07 DIAGNOSIS — Z951 Presence of aortocoronary bypass graft: Secondary | ICD-10-CM | POA: Diagnosis not present

## 2016-12-07 LAB — BASIC METABOLIC PANEL
Anion gap: 9 (ref 5–15)
BUN: 24 mg/dL — AB (ref 6–20)
CALCIUM: 9.2 mg/dL (ref 8.9–10.3)
CO2: 25 mmol/L (ref 22–32)
CREATININE: 2.24 mg/dL — AB (ref 0.61–1.24)
Chloride: 105 mmol/L (ref 101–111)
GFR calc Af Amer: 30 mL/min — ABNORMAL LOW (ref >60)
GFR, EST NON AFRICAN AMERICAN: 26 mL/min — AB (ref >60)
GLUCOSE: 173 mg/dL — AB (ref 65–99)
Potassium: 4.1 mmol/L (ref 3.5–5.1)
SODIUM: 139 mmol/L (ref 135–145)

## 2016-12-07 LAB — CBC
HCT: 28.3 % — ABNORMAL LOW (ref 39.0–52.0)
Hemoglobin: 8.8 g/dL — ABNORMAL LOW (ref 13.0–17.0)
MCH: 27.4 pg (ref 26.0–34.0)
MCHC: 31.1 g/dL (ref 30.0–36.0)
MCV: 88.2 fL (ref 78.0–100.0)
PLATELETS: 61 10*3/uL — AB (ref 150–400)
RBC: 3.21 MIL/uL — ABNORMAL LOW (ref 4.22–5.81)
RDW: 19 % — AB (ref 11.5–15.5)
WBC: 3.6 10*3/uL — AB (ref 4.0–10.5)

## 2016-12-07 LAB — MAGNESIUM: Magnesium: 1.8 mg/dL (ref 1.7–2.4)

## 2016-12-07 LAB — SAVE SMEAR

## 2016-12-07 MED ORDER — MAGNESIUM SULFATE 2 GM/50ML IV SOLN
2.0000 g | Freq: Once | INTRAVENOUS | Status: AC
  Administered 2016-12-07: 2 g via INTRAVENOUS
  Filled 2016-12-07: qty 50

## 2016-12-07 MED ORDER — IPRATROPIUM-ALBUTEROL 0.5-2.5 (3) MG/3ML IN SOLN
3.0000 mL | Freq: Four times a day (QID) | RESPIRATORY_TRACT | Status: DC
  Administered 2016-12-08 (×2): 3 mL via RESPIRATORY_TRACT
  Filled 2016-12-07 (×2): qty 3

## 2016-12-07 NOTE — Consult Note (Signed)
   [facility] CM Inpatient Consult   12/07/2016  Sydney Azure 02/14/1933 [id number]  Patient was assessed for a second hospitalization in the past couple of months.  Patient is in the [place] registry.  Chart review reveals the patient is 83 year old man with heart failure with reduced ejection fraction, pulmonary artery hypertension, COPD, and ischemic heart disease came to the emergency department because of progressive shortness of breath with exertion over the last several days. He was admitted to the hospitalist service on 2/28 with volume overload, diuresed for 4 days, discharged with a dry weight of 61 kg and supplemental oxygen for home. He lives with his wife here in [place]. Recently moved to [place] from [place]. He tells me that he was admitted about 3 times last year, once requiring ICU level care. He had a cardiologist but has not established with one here in [place] yet per MD notes.  Met with the patient and he states he is adjusting to [facility] weather and environment.  He states, "I feel like it's cold one day and hot the next and getting out my sinuses has not adjusted to all of the elements."  Patient states he will accept post hospital calls. No other needs noted.  Denies transportation needs states that he drives, has established a Scientist, research (physical sciences). Patient has gone to [employer]. He states his daughter takes care of his medical management needs. For questions, please contact:  Natividad Brood, RN BSN [facility] Liaison  [phone] business mobile phone Toll free office [phone]

## 2016-12-07 NOTE — Progress Notes (Signed)
Internal Medicine Attending:   I saw and examined the patient. I reviewed the resident's note and I agree with the resident's findings and plan as documented in the resident's note.  83 year old man with heart failure with reduced ejection fraction admitted with volume overload and equivocal presentation that looks mostly like right heart failure. We started diuresis yesterday, he is responding nicely with already 1 L of urine so far today. Plan to continue with diuresis. Pulmonary function is stable with only mild pulmonary edema. We're going to consult advanced heart failure given the complexity of his heart disease, I think he might benefit from left and right heart catheterizations to evaluate pulmonary pressures and the burden of ischemic disease. Also wonder how much his mitral regurgitation is worsening his condition. Also has a wide QRS and should be evaluated for benefits of biventricular pacing.

## 2016-12-07 NOTE — Progress Notes (Signed)
Patient was stable on 7pm-7am shift. Patient had no  Complaints of pain. VSS. No concerns at this time.

## 2016-12-07 NOTE — Progress Notes (Signed)
   Subjective: Patient denies chest pain, shortness of breath at rest; he has not ambulated yet which is when he becomes symptomatic.   Objective:  Vital signs in last 24 hours: Vitals:   12/06/16 1957 12/07/16 0035 12/07/16 0607 12/07/16 0755  BP: 140/81 128/62 123/60 130/85  Pulse: 88 80 79 83  Resp: Temp: 97.5 F (36.4 C) 97.6 F (36.4 C) 97.5 F (36.4 C)   TempSrc: Oral Oral Oral   SpO2: 100% 96% 100%   Weight: 71.9 kg (158 lb 8 oz)  71.3 kg (157 lb 1.6 oz)   Height:  (1.727 m)      Constitutional: NAD CV: RRR, systolic ejection murmur best heard at apex, pulses intact, 2+ pitting edema BLE, warm extremities Resp: decreased breath sounds throughout, diffuse wheezing, bibasilar rales, no increased work of breathing Abd: soft, NDNT, +BS  Assessment/Plan:  Principal Problem:   Acute exacerbation of congestive heart failure (HCC) Active Problems:   COPD (chronic obstructive pulmonary disease) (HCC)   CAD in native artery   Thrombocytopenia (HCC)   CKD (chronic kidney disease)  Acute on chronic sCHF:  Patient with 0.6kg weight loss since admission (1.5L UOP). Symptomatically he is the same but has not been out of bed yet. Bedside ultrasound revealed 2-3 B lines bilaterally with no pleural effusions or consolidation, and mildly dilated IVC without resp variation consistent with mild pulm edema.  --consult HF for possible L/RHC --lasix  BID --spironolactone  daily --daily weights --strict ins/outs --fluid restricted diet --follow AM Bmet, Mag - goal K >4, Mg >2, replete as necessary --discussed diet and fluid restricted diet; will continue addressing prior to discharge  ICM, s/p CABG, s/p ICD HLD HTN Patient with last echo in March 2018 showing EF 20-25%, moderate aortic and mitral valve regurgitation with mild LA dilation, diffuse hypokinesis; EKG showed widened QRS complexes. Unfortunately, patient has only recently moved from [place] and we have no  medical records of his prior heart history or workup; he has also had 2 admissions so far in two months - it would be prudent to involve HF team while inpatient to help optimize patient's cardiac function in addition to current management and education on med/diet compliance.  --spironolactone  daily, atorvastatin  daily --hold lisinopril and carvedilol  --Home O2 - 2L qhs and PRN with activity --consult HF for possible L/RHC, valve dysfunction and possible biventricular pacing in setting of severe heart dysfunction and widened QRS.  AKI:  Cr continues to be elevated to 2.24 this AM, likely in setting of volume overload.  --continue diuresis with lasix as above --hold home lisinopril --follow Bmets  COPD, on home oxygen: Some wheezing on exam today but it does not seem to be significant at this time. --Breo --duoneb PRN --Home O2 - 2L qhs and PRN with activity  Pancytopenia:  Pt with leukopenia, anemia (normal MCV), thrombocytopenia and had a reticulocyte count of 1.1% at last admission. With his age, he is at risk for myelodysplastic syndrome. --review peripheral smear  Diet: HH, fluid restricted DVT ppx: SCDs Code: DNR  Dispo: Anticipated discharge in approximately 2-3 day(s).   Nyra Market, MD 12/07/2016, 11:24 AM Pager [phone]

## 2016-12-07 NOTE — Progress Notes (Signed)
SCD ordered and placed on pt. Education provided to pt on use and prevention. Educated pt to continue ambulating. Pt understanding    Darrell Glass Radon

## 2016-12-07 NOTE — Care Management Note (Signed)
Case Management Note  Patient Details  Name: Darrell Glass MRN: [id number] Date of Birth: 1933-06-29  Subjective/Objective:       Admitted with CHF            Action/Plan: 12/07/2016- CM following for DCP; B Tyreona Panjwani RN  10/06/2016 - Subjective/Objective:                 Spke with patient and daughter about DC planning needs. Patient is relocating to [facility] from [place]. [name] assisting with transition. Darrell Glass provided insurance information and name of DME O2 supplier. Patient has copy of Medicare A&B in Epic documents, and [employer] listed as well. CM spoke with rep at [employer]. [phone]) who confirmed patient had picked up concentrator for night time oxygen use only. They could not provide dose. They are a local company and cannot supply to . Patient will need qualifying oxygen sats, order for home O2, and referral placed if DC plan includes home oxygen. Daughter would like assistance with obtaining PCP. PCP apt with [facility] made and entered in AVS; Debbie Swist RN  10/07/2016 - Spoke with Dr. Ella Jubilee, requested home O2 order, [facility] checking insurance.  Also notified daughter requesting call. Darrell Daughters RN  Expected Discharge Date:  12/09/16               Expected Discharge Plan:  Home/Self Care  Discharge planning Services  CM Consult  Status of Service:  In process, will continue to follow  Darrell Glass [phone] 12/07/2016, 11:20 AM

## 2016-12-07 NOTE — Progress Notes (Signed)
Subjective: Mr. Kozak is an 83yo male with PMH of HTN, HLD, systolic heart failure, CAD s/p CABG, COPD on PRN and qhs 2L O2 at home, CKD stage 3, thrombocytopenia presenting to [facility] with one week history of exertional dyspnea, orthopnea, bendopnea, nonproductive cough, and bil LE edema. On evaluation during rounds this morning, he was alert and oriented and able to deny chest pain and shortness of breath at rest, but did endorse shortness of breath if he lays down without his head elevated, consistent with baseline. He has not ambulated since admission, which is typically when he is symptomatic. He did mention that he moved to [place] from [place] in March and does not have a cardiologist who is helping him to manage his chronic heart failure.   Objective: Vital signs in last 24 hours: Vitals:   12/06/16 1957 12/07/16 0035 12/07/16 0607 12/07/16 0755  BP: 140/81 128/62 123/60 130/85  Pulse: 88 80 79 83  Resp: Temp: 97.5 F (36.4 C) 97.6 F (36.4 C) 97.5 F (36.4 C)   TempSrc: Oral Oral Oral   SpO2: 100% 96% 100%   Weight: 71.9 kg (158 lb 8 oz)  71.3 kg (157 lb 1.6 oz)   Height:  (1.727 m)      Weight change: 61.5 kg on discharge in March to 72 kg on admission, recently down to 71.3 kg since admission   Intake/Output Summary (Last 24 hours) at 12/07/16 1316 Last data filed at 12/07/16 1238  Gross per 24 hour  Intake              530 ml  Output             2625 ml  Net            -2095 ml   Physical Exam  Constitutional: He appears well-nourished. No distress.  HENT:  Head: Normocephalic and atraumatic.  Eyes: Conjunctivae are normal. No scleral icterus.  Cardiovascular:  Murmur (systolic ejection mumor best heard at apex per resident ) heard. Pulmonary/Chest: He has wheezes.  Abdominal: Soft. Bowel sounds are normal.  Musculoskeletal: He exhibits edema (pitting in bilateral shins ).  Skin: He is not diaphoretic.    Lab Results: Basic Metabolic  Panel:  Recent Labs Lab 12/06/16 1202 12/07/16 0541 12/07/16 0952  NA 141 139  --   K 4.7 4.1  --   CL 109 105  --   CO2 26 25  --   GLUCOSE 112* 173*  --   BUN 19 24*  --   CREATININE 2.11* 2.24*  --   CALCIUM 9.9 9.2  --   MG  --   --  1.8   Liver Function Tests:  Recent Labs Lab 12/06/16 1408  AST 26  ALT 16*  ALKPHOS 105  BILITOT 2.5*  PROT 7.5  ALBUMIN 3.5   CBC:  Recent Labs Lab 12/06/16 1202 12/07/16 0541  WBC 5.2 3.6*  HGB 9.9* 8.8*  HCT 32.1* 28.3*  MCV 88.7 88.2  PLT 67* 61*    Assessment/Plan: Mr. Patchell it an 83 year old male with a history of COPD, CAD s/p CABG, CKD and presenting with a one week history of exertional dyspnea consistent with an acute exacerbation of CHF. His cough without sputum production, pitting edema in bilateral lower extremities and fluid evident in the lungs through ultrasound of the chest indicate volume overload secondary to CHF as a likely etiology of his DOE  Acute of Chronic CHF: -consult HF for possible RHF -d/c carvedilol  -lasix  BID -spironolactone 25 mg daily  -daily weights and strict ins and outs  -Mg>2, K>4  AKI: Cr to 2.24  -hold home lasix  -Follow Bmets   COPD: Weezing on exam today but likely related to CHF and not COPD exacerbation  -Home O2- 2L qhs, currently 3L   Pancytopenia:  -leukopenia, anemia, thrombocytopenia, with age he is at risk for myelodysplastic syndrome  -review peripheral smear   Diet: Fluid Restricted  DVT ppx: SCDs  Code: DNR  This is a Psychologist, occupational Note.  The care of the patient was discussed with Dr. Samuella Cota and the assessment and plan formulated with their assistance.  Please see their attached note for official documentation of the daily encounter.   LOS: 0 days   Myra Rude, Medical Student 12/07/2016, 1:16 PM

## 2016-12-07 NOTE — Progress Notes (Signed)
Patient ID: Darrell Glass MRN: [id number], DOB/AGE: Nov 06, 1932   Admit date: 12/06/2016  Reason for Consult: CHF Requesting Physician: Dr. Oswaldo Done, Internal Medicine    Primary Physician: No PCP Per Patient Primary Cardiologist: New (was followed in [place])   Pt. Profile:  [place] Glass is a 83 y.o. male, recently moved from [place], with a h/o IHD/CAD with prior CABG, systolic HF with EF of 20-25%, s/p ICD, CKD, HTN, HLD, COPD, mitral regurgitation and Pulmonary HTN who is being seen today for the evaluation of acute on chronic systolic HF, at the request of Dr. Oswaldo Done, Internal Medicine.   Problem List  Past Medical History:  Diagnosis Date  . AICD (automatic cardioverter/defibrillator) present   . Arthritis    "small of my back" (10/06/2016)  . CHF (congestive heart failure) (HCC)   . Chronic kidney disease (CKD), stage III (moderate)    stage III or IV/notes 10/06/2016  . COPD (chronic obstructive pulmonary disease) (HCC)   . Coronary artery disease   . High cholesterol   . Hypertension   . Iron deficiency anemia    "takes iron pill" (10/06/2016)  . Myocardial infarction [facility])    "led to triple bypass & defibrillator"   . On home oxygen therapy    "just relocated this weekend from [place]; he was on O2 there; don't have any here" (10/06/2016)  . Pneumonia    "@ least twice" (10/06/2016)    Past Surgical History:  Procedure Laterality Date  . CARDIAC CATHETERIZATION    . CORONARY ARTERY BYPASS GRAFT     CABG X3  . INSERT / REPLACE / REMOVE PACEMAKER       Allergies  No Known Allergies  HPI  [place] Glass is a 83 y.o. male, recently moved from [place], with a h/o IHD/CAD with prior CABG, systolic HF with EF of 20-25%, s/p ICD, CKD, HTN, HLD, COPD, mitral regurgitation and Pulmonary HTN who is being seen today for the evaluation of acute on chronic systolic HF, at the request of Dr. Fannie Knee, Internal Medicine.   Per chart review, he recently moved from [place] 2 months ago and was being followed by  a cardiologist there but has not established care with a provider here yet. He has been admitted once before to [facility] for acute CHF. He was admitted by hospitalist service 10/05/16 for volume overload. 2D echo 10/06/16 showed reduced LVEF at 20% to 25% with diffuse hypokinesis. There was akinesis of the anteroseptal, anterior, inferoseptal, and apical myocardium. Moderate MR was noted. PA pressure was elevated at 43 mm Hg. He was treated and diuresed with IV lasix. Discharge weight was reported to be 61 kg. He was sent home on supplemental O2.  He presented back to hospital on 12/06/16 with a complaint of dyspnea, progressively worsening over the last several weeks. He is not compliant with daily weights at home but has noticed slight swelling around his ankles. He also notes orthopnea and PND. He denies CP. No ICD shocks. He does not use table salt but admits that he is not conscientious when it comes to reading food labels for sodium content. He also eats out a lot. He is compliant with his meds "most of the time". He admits that he sometimes will avoid taking lasix to avoid the inconvenience of frequent urination.  His weight today is 71 kg (157 lb), previous dry weight 61 kg. BNP markedly elevated at 4,216. SCr is 2.24/ BUN 24. His baseline SCr appears to be 1.6-1.8.  K is WNL at 4.1. Hgb 8.8. EKG shows sinus rhythm with 1st degree A-V block, Left ventricular hypertrophy with QRS widening and ST & T wave abnormality in inferolateral leads.  He has been admitted by internal medicine and placed on IV Lasix. He denies any resting dyspnea currently. He is still resting with the head of the bed elevated. No CP.   Home Medications  Prior to Admission medications   Medication Sig Start Date End Date Taking? Authorizing Provider  albuterol (PROVENTIL HFA;VENTOLIN HFA) 108 (90 Base) MCG/ACT inhaler Inhale 1-2 puffs into the lungs every 6 (six) hours as needed for wheezing or shortness of breath.   Yes Historical  Provider, MD  atorvastatin (LIPITOR) 40 MG tablet Take 40 mg by mouth at bedtime.   Yes Historical Provider, MD  carvedilol (COREG) 12.5 MG tablet Take 12.5 mg by mouth 2 (two) times daily with a meal.   Yes Historical Provider, MD  famotidine (PEPCID) 20 MG tablet Take 20 mg by mouth daily.   Yes Historical Provider, MD  finasteride (PROSCAR) 5 MG tablet Take 5 mg by mouth every evening.   Yes Historical Provider, MD  fluticasone furoate-vilanterol (BREO ELLIPTA) 100-25 MCG/INH AEPB Inhale 1 puff into the lungs every morning.   Yes Historical Provider, MD  naproxen sodium (ANAPROX) 220 MG tablet Take 440 mg by mouth 2 (two) times daily as needed (for pain).   Yes Historical Provider, MD  spironolactone (ALDACTONE) 25 MG tablet Take 25 mg by mouth every morning.   Yes Historical Provider, MD  tamsulosin (FLOMAX) 0.4 MG CAPS capsule Take 0.4 mg by mouth daily after breakfast.   Yes Historical Provider, MD  furosemide (LASIX) 40 MG tablet Take 1 tablet (40 mg total) by mouth every morning. Patient not taking: Reported on 12/06/2016 10/08/16   Mauricio Annett Gula, MD    Hospital Medications  . atorvastatin  40 mg Oral QHS  . famotidine  20 mg Oral Daily  . finasteride  5 mg Oral QPM  . fluticasone furoate-vilanterol  1 puff Inhalation Daily  . furosemide  40 mg Intravenous BID  . spironolactone  25 mg Oral Daily  . tamsulosin  0.4 mg Oral QPC breakfast   . magnesium sulfate 1 - 4 g bolus IVPB 2 g (12/07/16 1238)   ipratropium-albuterol, ondansetron **OR** ondansetron (ZOFRAN) IV  Family History  Family History  Problem Relation Age of Onset  . CAD Neg Hx   . Diabetes Mellitus II Neg Hx     Social History  Social History   Social History  . Marital status: Divorced    Spouse name: N/A  . Number of children: N/A  . Years of education: N/A   Occupational History  . Not on file.   Social History Main Topics  . Smoking status: Former Smoker    Packs/day: 2.00    Years: 71.00     Types: Cigarettes  . Smokeless tobacco: Never Used     Comment: 10/06/2016 "stopped ~ 09/2015"  . Alcohol use No     Comment: 10/06/2016 "nothing for years and years"  . Drug use: No  . Sexual activity: Not on file   Other Topics Concern  . Not on file   Social History Narrative  . No narrative on file     Review of Systems General:  No chills, fever, night sweats or weight changes.  Cardiovascular:  No chest pain, dyspnea on exertion, edema, orthopnea, palpitations, paroxysmal nocturnal dyspnea. Dermatological: No rash, lesions/masses Respiratory:  No cough, dyspnea Urologic: No hematuria, dysuria Abdominal:   No nausea, vomiting, diarrhea, bright red blood per rectum, melena, or hematemesis Neurologic:  No visual changes, wkns, changes in mental status. All other systems reviewed and are otherwise negative except as noted above.  Physical Exam  Blood pressure 130/85, pulse 83, temperature 97.5 F (36.4 C), temperature source Oral, resp. rate 18, height  (1.727 m), weight 157 lb 1.6 oz (71.3 kg), SpO2 100 %.  General: Pleasant, NAD, elderly Psych: Normal affect. Neuro: Alert and oriented X 3. Moves all extremities spontaneously. HEENT: Normal  Neck: Supple without bruits, elevated JVD Lungs:  Resp regular and unlabored, diffuse, bilateral wheezing and rhonchi Heart: RRR no s3, s4, or murmurs. Abdomen: Soft, non-tender, non-distended, BS + x 4.  Extremities: No clubbing, cyanosis 1+ edema around the ankles bilaterally, no pretibial edema. DP/PT/Radials 2+ and equal bilaterally.  Labs  Troponin [facility] Test)  Recent Labs  12/06/16 1217  TROPIPOC 0.03   No results for input(s): CKTOTAL, CKMB, TROPONINI in the last 72 hours. Lab Results  Component Value Date   WBC 3.6 (L) 12/07/2016   HGB 8.8 (L) 12/07/2016   HCT 28.3 (L) 12/07/2016   MCV 88.2 12/07/2016   PLT 61 (L) 12/07/2016    Recent Labs Lab 12/06/16 1408 12/07/16 0541  NA  --  139  K  --  4.1   CL  --  105  CO2  --  25  BUN  --  24*  CREATININE  --  2.24*  CALCIUM  --  9.2  PROT 7.5  --   BILITOT 2.5*  --   ALKPHOS 105  --   ALT 16*  --   AST 26  --   GLUCOSE  --  173*   No results found for: CHOL, HDL, LDLCALC, TRIG No results found for: DDIMER   Radiology/Studies  Dg Chest 2 View  Result Date: 12/06/2016 CLINICAL DATA:  Bronchitis for a few months with shortness of breath. EXAM: CHEST  2 VIEW COMPARISON:  October 05, 2016 FINDINGS: Stable cardiomegaly an AICD device. The hila and mediastinum are unchanged. No pneumothorax. No pulmonary nodules or masses. No focal infiltrate. IMPRESSION: No active cardiopulmonary disease. Electronically Signed   By: Gerome Sam III M.D   On: 12/06/2016 12:59    ECG  Sinus rhythm with 1st degree A-V block Left ventricular hypertrophy with QRS widening ST & T wave abnormality, consider inferolateral ischemia -- personally reviewed  Telemetry  NSR with 1st degree AV block-- personally reviewed   Echocardiogram 10/06/16 (previous admission) Study Conclusions  - Left ventricle: The cavity size was moderately dilated. Systolic   function was severely reduced. The estimated ejection fraction   was in the range of 20% to 25%. Diffuse hypokinesis. There is   akinesis of the anteroseptal, anterior, inferoseptal, and apical   myocardium. Acoustic contrast opacification revealed no evidence   ofthrombus. - Aortic valve: Valve mobility was restricted. There was moderate   regurgitation. - Mitral valve: Severely thickened leaflets . There was moderate   regurgitation directed posteriorly. - Left atrium: The atrium was mildly dilated. - Right ventricle: The cavity size was moderately dilated. Wall   thickness was normal. Pacer wire or catheter noted in right   ventricle. - Tricuspid valve: There was mild regurgitation. - Pulmonary arteries: Systolic pressure was mildly increased. PA   peak pressure: 43 mm Hg (S).   ASSESSMENT  AND PLAN  Principal Problem:   Acute exacerbation  of congestive heart failure (HCC) Active Problems:   COPD (chronic obstructive pulmonary disease) (HCC)   CAD in native artery   Thrombocytopenia (HCC)   CKD (chronic kidney disease)   1. Acute on Chronic Systolic CHF: Ischemic Cardiomyopathy w/ chronic systolic HF. EF 20-25%. Has CAD s/p CABG and has an ICD. Also with pulmonary HTN and moderate MR. Admission BNP markedly elevated at >4K. Weight up 10 kg beyond previous dry weight when he was discharged in March. 71 Kg today (dry weight 61 kg). Being treated with IV Lasix, 40 mg BID. I/Os negative 2.3L. A/c kidney injury with rising SCr from 2.11>>2.24 with diuresis (baseline is 1.6-1.8). Continue to monitor closely. If continued rising SCr and poor diuresis, we may consider Advance HF Consult. I think he would also benefit from long term f/u in their office. Also recommend RHC this admission to help guide diuresis. His lung exam is markedly abnormal with rhonchi and wheezing, however CXR 5/1 showed no edema. May need treatment for COPD. Continue strict I/Os, daily weights and low sodium diet. He is on spironolactone. If worsening renal function, we may need to hold. Not on a BB. ? If due to COPD. No ACE/ARB given CKD. Consider addition of hydralazine + nitrates.   2. CAD: h/o CABG in [place]. He denies CP. Given worsening renal function, we will avoid LHC. Continue medical therapy. We need to try to obtain records from [place]. He is not on ASA (? If due to anemia). Not on BB (? If due to COPD). No ACE/ARB given CKD. He is on statin therapy with Lipitor 40.   3. ICD: we will need to get him established in our EP clinic so that this can be followed. He denies any h/o ICD shocks.   4. Acute on Chronic Kidney Disease: baseline SCr 1.6-1.8. Admit SCr 2.11. Further increase with diuresis to 2.24. Monitor closely. K is stable.   5. Pulmonary HTN: Most recent echo 10/2016 showed PA pressure of 43 mmHg. Can further  evaluate with RHC, if we elect to do so.   6. COPD: lung exam abnormal with diffuse bilateral rhonchi and wheezing. CXR 5/1 w/o edema. ? Acute exacerbation. management per IM.   7. Mitral Regurgitation: moderate by recent echo.   Signed, Robbie Lis, PA-C, MHS 12/07/2016, 12:56 PM [facility] Pager: [phone]  I have seen and examined the patient along with Robbie Lis, PA.  I have reviewed the chart, notes and new data.  I agree with PA's note.  Key new complaints: still orthopneic but improving. Decompensation is related to noncompliance with meds, but he claims that he carefully avoids salt. Key examination changes: JVP to angle of jaw, bilateral rales and wheezes obscure the cardiac exam. Key new findings / data: slight worsening of creatinine with diuresis is acceptable.  PLAN: Continue aggressive diuresis, hold spironolactone temporarily. Educated re: daily weight monitoring (which he claims he has never been told to do). Consider CRT-D due to LBBB, but relatively narrow QRS (125 ms) and ischemic etiology of cardiomyopathy lessen the odds of benefit. Will follow along. Good candidate for referral to CHF clinic at [place].  [name], MD, [facility] [phone] 12/07/2016, 4:25 PM

## 2016-12-08 DIAGNOSIS — I5043 Acute on chronic combined systolic (congestive) and diastolic (congestive) heart failure: Secondary | ICD-10-CM

## 2016-12-08 DIAGNOSIS — N189 Chronic kidney disease, unspecified: Secondary | ICD-10-CM

## 2016-12-08 DIAGNOSIS — J9611 Chronic respiratory failure with hypoxia: Secondary | ICD-10-CM

## 2016-12-08 DIAGNOSIS — I130 Hypertensive heart and chronic kidney disease with heart failure and stage 1 through stage 4 chronic kidney disease, or unspecified chronic kidney disease: Principal | ICD-10-CM

## 2016-12-08 LAB — CBC
HCT: 29.4 % — ABNORMAL LOW (ref 39.0–52.0)
Hemoglobin: 9.2 g/dL — ABNORMAL LOW (ref 13.0–17.0)
MCH: 27.5 pg (ref 26.0–34.0)
MCHC: 31.3 g/dL (ref 30.0–36.0)
MCV: 87.8 fL (ref 78.0–100.0)
PLATELETS: 71 10*3/uL — AB (ref 150–400)
RBC: 3.35 MIL/uL — ABNORMAL LOW (ref 4.22–5.81)
RDW: 19 % — AB (ref 11.5–15.5)
WBC: 7.9 10*3/uL (ref 4.0–10.5)

## 2016-12-08 LAB — BASIC METABOLIC PANEL
Anion gap: 10 (ref 5–15)
BUN: 28 mg/dL — AB (ref 6–20)
CALCIUM: 9.3 mg/dL (ref 8.9–10.3)
CO2: 28 mmol/L (ref 22–32)
CREATININE: 2.31 mg/dL — AB (ref 0.61–1.24)
Chloride: 101 mmol/L (ref 101–111)
GFR calc Af Amer: 29 mL/min — ABNORMAL LOW (ref >60)
GFR, EST NON AFRICAN AMERICAN: 25 mL/min — AB (ref >60)
GLUCOSE: 173 mg/dL — AB (ref 65–99)
Potassium: 3.9 mmol/L (ref 3.5–5.1)
SODIUM: 139 mmol/L (ref 135–145)

## 2016-12-08 LAB — MAGNESIUM: Magnesium: 2.2 mg/dL (ref 1.7–2.4)

## 2016-12-08 MED ORDER — POTASSIUM CHLORIDE CRYS ER 20 MEQ PO TBCR
40.0000 meq | EXTENDED_RELEASE_TABLET | Freq: Once | ORAL | Status: AC
  Administered 2016-12-08: 40 meq via ORAL
  Filled 2016-12-08: qty 2

## 2016-12-08 MED ORDER — ASPIRIN 81 MG PO CHEW
81.0000 mg | CHEWABLE_TABLET | Freq: Every day | ORAL | Status: DC
  Administered 2016-12-08 – 2016-12-12 (×5): 81 mg via ORAL
  Filled 2016-12-08 (×5): qty 1

## 2016-12-08 MED ORDER — IPRATROPIUM-ALBUTEROL 0.5-2.5 (3) MG/3ML IN SOLN
3.0000 mL | Freq: Three times a day (TID) | RESPIRATORY_TRACT | Status: DC
  Administered 2016-12-09: 3 mL via RESPIRATORY_TRACT
  Filled 2016-12-08: qty 3

## 2016-12-08 MED ORDER — ORAL CARE MOUTH RINSE
15.0000 mL | Freq: Two times a day (BID) | OROMUCOSAL | Status: DC
  Administered 2016-12-08 – 2016-12-12 (×7): 15 mL via OROMUCOSAL

## 2016-12-08 NOTE — Progress Notes (Signed)
Progress Note  Glass Name: Darrell Glass Date of Encounter: 12/08/2016  Primary Cardiologist: NEW (Trinika Cortese)  Subjective   Breathing much better, no angina. 3L net diuresis. Weight down almost 2 kg. Renal parameters essentially unchanged.  ICD interrogation ([employer]) with normal device function, no recent NSVT, no atrial fibrillation or VT therapies ever recorded. Normal generator and lead parameters. Optivol shows HF decompensation preceding admission by several weeks.  Inpatient Medications    Scheduled Meds: . atorvastatin  40 mg Oral QHS  . famotidine  20 mg Oral Daily  . finasteride  5 mg Oral QPM  . fluticasone furoate-vilanterol  1 puff Inhalation Daily  . furosemide  40 mg Intravenous BID  . ipratropium-albuterol  3 mL Nebulization Q6H WA  . mouth rinse  15 mL Mouth Rinse BID  . tamsulosin  0.4 mg Oral QPC breakfast   Continuous Infusions:  PRN Meds: ondansetron **OR** ondansetron (ZOFRAN) IV   Vital Signs    Vitals:   12/08/16 0404 12/08/16 0851 12/08/16 0952 12/08/16 1145  BP: (!) 115/53  (!) 110/52   Pulse: 71  69   Resp: 18  18   Temp: 97.8 F (36.6 C)     TempSrc: Oral     SpO2: 100% 100% 96% 96%  Weight: 69.6 kg (153 lb 6.4 oz)     Height:        Intake/Output Summary (Last 24 hours) at 12/08/16 1205 Last data filed at 12/08/16 0855  Gross per 24 hour  Intake             1220 ml  Output             2350 ml  Net            -1130 ml   Filed Weights   12/06/16 1957 12/07/16 0607 12/08/16 0404  Weight: 71.9 kg (158 lb 8 oz) 71.3 kg (157 lb 1.6 oz) 69.6 kg (153 lb 6.4 oz)    Telemetry    NSR - Personally Reviewed  ECG    Sinus rhythm with 1st degree A-V block, LBBB  Personally Reviewed  Physical Exam  Still orthopneic, speaks in short interrupted sentences, but better than yesterday GEN: No acute distress.   Neck: 10 cm JVD Cardiac: RRR, 2-3/6 holosystolic RLSB and apical murmurs, no diastolic murmurs, rubs, S3 present.  Respiratory:  a few rales and wheezes bilaterally. GI: Soft, nontender, non-distended  MS: No edema; No deformity. Neuro:  Nonfocal  Psych: Normal affect   Labs    Chemistry Recent Labs Lab 12/06/16 1202 12/06/16 1408 12/07/16 0541 12/08/16 0349  NA 141  --  139 139  K 4.7  --  4.1 3.9  CL 109  --  105 101  CO2 26  --  25 28  GLUCOSE 112*  --  173* 173*  BUN 19  --  24* 28*  CREATININE 2.11*  --  2.24* 2.31*  CALCIUM 9.9  --  9.2 9.3  PROT  --  7.5  --   --   ALBUMIN  --  3.5  --   --   AST  --  26  --   --   ALT  --  16*  --   --   ALKPHOS  --  105  --   --   BILITOT  --  2.5*  --   --   GFRNONAA 28*  --  26* 25*  GFRAA 32*  --  30* 29*  ANIONGAP  6  --  9 10     Hematology Recent Labs Lab 12/06/16 1202 12/07/16 0541 12/08/16 0349  WBC 5.2 3.6* 7.9  RBC 3.62* 3.21* 3.35*  HGB 9.9* 8.8* 9.2*  HCT 32.1* 28.3* 29.4*  MCV 88.7 88.2 87.8  MCH 27.3 27.4 27.5  MCHC 30.8 31.1 31.3  RDW 19.3* 19.0* 19.0*  PLT 67* 61* 71*    Cardiac EnzymesNo results for input(s): TROPONINI in the last 168 hours.  Recent Labs Lab 12/06/16 1217  TROPIPOC 0.03     BNP Recent Labs Lab 12/06/16 1408  BNP 4,216.7*     DDimer No results for input(s): DDIMER in the last 168 hours.   Radiology    Dg Chest 2 View  Result Date: 12/06/2016 CLINICAL DATA:  Bronchitis for a few months with shortness of breath. EXAM: CHEST  2 VIEW COMPARISON:  October 05, 2016 FINDINGS: Stable cardiomegaly an AICD device. The hila and mediastinum are unchanged. No pneumothorax. No pulmonary nodules or masses. No focal infiltrate. IMPRESSION: No active cardiopulmonary disease. Electronically Signed   By: Gerome Sam III M.D   On: 12/06/2016 12:59   Glass Profile     83 y.o. male recently moved from [place], with a h/o IHD/CAD with prior CABG, systolic HF with EF of 20-25%, s/p ICD, CKD, HTN, HLD, COPD, mitral regurgitation and Pulmonary HTN who is being seen today for the second episode of acute on chronic  systolic HF in last 3 months.  Assessment & Plan    1. Acute on Chronic Systolic CHF: Ischemic Cardiomyopathy w/ chronic systolic HF. EF 20-25%.  Weight up 10 kg beyond previous dry weight when he was discharged in March. 69.6 Kg today (dry weight 61 kg). Being treated with IV Lasix, 40 mg BID. I/Os negative 2.3L. Continue diuresis, strict I/Os, daily weights and low sodium diet. Holding spironolactone. If worsening renal function, we may need to hold. Not a good time to start beta blocker (if we do initiate, use bisoprolol due to COPD). No ACE/ARB given CKD. Consider addition of hydralazine + nitrates as BP allows, but his DBP is quite low.  Clearly still volume overloaded, premature to consider RHC yet.  2. CAD: h/o CABG in [place]. No angina.  Continue medical therapy. We need to try to obtain records from [place]. Should be on ASA. He is on statin therapy with Lipitor 40. Target LDL<70.  3. ICD: have made arrangements for remote f/u in our device clinic, also f/u in [facility]. He has never received ICD shocks.   4. Acute on Chronic Kidney Disease: baseline SCr 1.6-1.8. Admit SCr 2.11. Further increase with diuresis, but acceptable change. Monitor closely. K is stable.   6. COPD: he is on home O2 and has 70 year smoking history, minimum 1 ppd, quit 2 years ago. On LABA.  7. Mitral Regurgitation: moderate by recent echo. Reviewed echo - I think it is moderate to severe; looks like ischemic MR due to lateral wall motion abnormality and posterior leaflet tethering.  Signed, [name], MD  12/08/2016, 12:05 PM

## 2016-12-08 NOTE — Progress Notes (Signed)
Patient with no complaints or concerns during 7pm - 7am shift.  Vernel Langenderfer, RN 

## 2016-12-08 NOTE — Progress Notes (Signed)
Pat had 6 beats of VT. Pt asymptomatic, not aware what happed. Paged  IM, MD on call Obie DredgeBlum. MD made aware. No new orders. Will contine to monitor.  Djeneba Barsch, RN

## 2016-12-08 NOTE — Progress Notes (Signed)
   Subjective: Patient states he feels his breathing is improved from admission; he has not attempted to get out of bed or recline less than 30 degrees. He denies chest pain; his wheezing has improved.   Objective:  Vital signs in last 24 hours: Vitals:   12/08/16 0210 12/08/16 0404 12/08/16 0851 12/08/16 0952  BP: 124/72 (!) 115/53  (!) 110/52  Pulse: 69 71  69  Resp: 18 18  18   Temp: 98.4 F (36.9 C) 97.8 F (36.6 C)    TempSrc: Oral Oral    SpO2: 98% 100% 100% 96%  Weight:  69.6 kg (153 lb 6.4 oz)    Height:       Constitutional: NAD, sitting up in bed CV: RRR, holosystolic murmur best heard at apex, pulses intact, 1+ pitting edema BLE, warm extremities Resp: diffuse wheezing improved, no increased work of breathing Abd: soft, NDNT, +BS  Assessment/Plan:  Principal Problem:   Acute exacerbation of congestive heart failure (HCC) Active Problems:   COPD (chronic obstructive pulmonary disease) (HCC)   CAD in native artery   Thrombocytopenia (HCC)   CKD (chronic kidney disease)  Acute on chronic sCHF:  Patient with 2kg weight loss since admission; prior dry weight at 61kg. Patient reports some symptomatic improvement in breathing. --lasix 40mg  BID --d/c spironolactone 25mg  daily --daily weights; strict ins/outs --fluid restricted diet --PT/OT consulted; patient advised to mobilize today --follow AM Bmet, Mag - goal K >4, Mg >2, replete as necessary  ICM, s/p CABG, s/p ICD HLD HTN Cardiology evaluated patient yesterday; he will likely get RHC this admission and have stopped spironolactone in setting of AKI; they will help set up EP and HF outpatient follow up. We will hold off on restarting his lisinopril in setting of AKI; will likely restart carvedilol in next day or so - it was held due to very inconsistent use prior to hospitalization for HF exacerbation. Briefly discussed with HF team and they are comfortable following up outpatient with him rather than during  admission at this time. --atorvastatin 40mg  daily --hold lisinopril and carvedilol  --RHC this admission  --cardiology following - appreciate their assistance --Home O2 - 2L qhs and PRN with activity  AKI:  Cr continues to be elevated to 2.31 this AM, likely in setting of volume overload.  --continue diuresis with lasix as above --hold home lisinopril --follow Bmets  COPD, on home oxygen: Some wheezing on exam today but it does not seem to be significant at this time. --Virgel BouquetBreo - has not received yet during admission; RT contacted pharmacy to make available --duoneb q6hr --Home O2 - 2L qhs and PRN with activity  Pancytopenia:  Pt with leukopenia, anemia (normal MCV), thrombocytopenia and had a reticulocyte count of 1.1% at last admission. With his age, he is at risk for myelodysplastic syndrome. --review peripheral smear  Diet: HH, fluid restricted DVT ppx: SCDs Code: DNR  Dispo: Anticipated discharge in approximately 2-3 day(s).   Nyra MarketGorica Jermichael Belmares, MD 12/08/2016, 11:36 AM Pager [phone]

## 2016-12-08 NOTE — Progress Notes (Signed)
Internal Medicine Attending:   I saw and examined the patient. I reviewed the resident's note and I agree with the resident's findings and plan as documented in the resident's note.  Hospital day #3 with volume overload due to acute on chronic HFrEF. Responding well to furosemide 40mg  IV bid. Goal is to get weight back to around 61 kg, will have several more days of diuresis to go. Renal function is stable with CKD. Had 6 beats of VTach last night, K and mag are ok today. Cardiology service has been very helpful in managing his multiple cardiac problems. I agree with trying to slowly add BB, nitrate, and hydralazine over the next few days as vitals allow.

## 2016-12-08 NOTE — Progress Notes (Signed)
Subjective: Mr. Sanfilippo is doing well this morning. He said that he is having an easier time breathing and feels less tightness in his skin, a finding that he was complaining of at his original state of volume overload. However, it was reported that the patient had 6 beats of VT last night, which resolved. He is still sleeping with the head of his bed elevated and did not get up and out of bed much yesterday.   Objective: Vital signs in last 24 hours: Vitals:   12/08/16 0210 12/08/16 0404 12/08/16 0851 12/08/16 0952  BP: 124/72 (!) 115/53  (!) 110/52  Pulse: 69 71  69  Resp: 18 18  18   Temp: 98.4 F (36.9 C) 97.8 F (36.6 C)    TempSrc: Oral Oral    SpO2: 98% 100% 100% 96%  Weight:  69.6 kg (153 lb 6.4 oz)    Height:       Weight change: -2.313 kg (-5 lb 1.6 oz)  Intake/Output Summary (Last 24 hours) at 12/08/16 1107 Last data filed at 12/08/16 0855  Gross per 24 hour  Intake             1220 ml  Output             2350 ml  Net            -1130 ml   Physical Exam: Constitutional: NAD  Pulm: diffuse wheezing, no increased work of breathing  CV: 1+ pitting edema BLE, warm extremities, holosystolic murmur best appreciated at apex    Assessment/Plan: Principal Problem:   Acute exacerbation of congestive heart failure (HCC) Active Problems:   COPD (chronic obstructive pulmonary disease) (HCC)   CAD in native artery   Thrombocytopenia (HCC)   CKD (chronic kidney disease)  Acute on Chronic CHF: Mr. Lince is having less shortness of breath after diuresis and a 2.3 kg weight loss since admission. The patient's K+ was at 3.9 today, which is below the goal of 4 for a patient with a history of conduction abnormalities. Cardiology was consulted yesterday and mentioned that pulmonary artery catheterization could be useful to guide diuresis and also informative regarding his history of pulmonary hypertension. Based on the fluid buildup in his lungs and the lack of JVD, it is possible that  pulmonary hypertension leading to RHF could be contributing to the patient's CHF, so this could be warranted. In addition, Cardiology mentioned the need for follow up in both the electrophysiology and HF clinics.  --Continue lasix 40 BID --40 mEq K+ PO- up in chair for 30 minutes after administration  --Up and out of bed sitting in chair as much as possible  --daily weights  --follow AM bmet, Mag- goal K>4, Mg>2  AKI: Mr. Winterton Creatinine continued to rise today up to 2.31 form 2.24. In the setting of volume overload due to CHF and diuresis, this is most likely pre-renal, although this assumption is not supported by the BUN/Cr ratio of 12.1. The cause is most likely not post-renal, as Mr. Pippenger has had a urine output of 122.9 mL/ hr in the last 24 hours.Based on the rising Creatinine we should discontinue Spironolactone. However, based on the need for continued diuresis we can continue lasix until clinical euvolemia is evident.  --d/c spironolactone  --continue lasix 40 BID  --follow Bmets  COPD on home Oxygen: There was some wheezing on exam today. The shortness of breath that Mr. Hoen presented with seems attributable to an exacerbation of CHF vs. COPD  based on the increase in weight from his last discharge and the fluid evident in his lungs on bedside ultrasound yesterday. The wheezing has also improved in the last two days as diuresis has continued.  --duoneb PRN --Up and out of bed, ambulating as much as possible and in chair as tolerated  --Home O2- 2L and PRN with activity   Pancytopenia: Mr. Joaquim NamWhite ha a leukopenia, anemia with normal MCV and thrombocytopenia. Based on his risk for myelodysplastic syndrome, we sent a peripheral smear. This has been obtained but is pending analysis. --review peripheral smear      This is a Psychologist, occupationalMedical Student Note.  The care of the patient was discussed with Dr. Samuella CotaSvalina and the assessment and plan formulated with their assistance.  Please see their  attached note for official documentation of the daily encounter.   LOS: 1 day   Myra RudeJohn Roderick Calo, Medical Student 12/08/2016, 11:07 AM

## 2016-12-09 ENCOUNTER — Encounter (HOSPITAL_COMMUNITY)
Admission: EM | Disposition: A | Discharge status: Home Health | Payer: Self-pay | Source: Home / Self Care | Attending: Student in an Organized Health Care Education/Training Program

## 2016-12-09 DIAGNOSIS — I5023 Acute on chronic systolic (congestive) heart failure: Secondary | ICD-10-CM

## 2016-12-09 DIAGNOSIS — N179 Acute kidney failure, unspecified: Secondary | ICD-10-CM

## 2016-12-09 HISTORY — PX: RIGHT HEART CATH: CATH118263

## 2016-12-09 LAB — POCT I-STAT 3, VENOUS BLOOD GAS (G3P V)
Acid-Base Excess: 10 mmol/L — ABNORMAL HIGH (ref 0.0–2.0)
Bicarbonate: 36.3 mmol/L — ABNORMAL HIGH (ref 20.0–28.0)
O2 Saturation: 61 %
PCO2 VEN: 56.3 mmHg (ref 44.0–60.0)
PH VEN: 7.417 (ref 7.250–7.430)
TCO2: 38 mmol/L (ref 0–100)
pO2, Ven: 32 mmHg (ref 32.0–45.0)

## 2016-12-09 LAB — BASIC METABOLIC PANEL
Anion gap: 9 (ref 5–15)
BUN: 29 mg/dL — ABNORMAL HIGH (ref 6–20)
CO2: 35 mmol/L — ABNORMAL HIGH (ref 22–32)
CREATININE: 2.62 mg/dL — AB (ref 0.61–1.24)
Calcium: 9.6 mg/dL (ref 8.9–10.3)
Chloride: 97 mmol/L — ABNORMAL LOW (ref 101–111)
GFR calc Af Amer: 25 mL/min — ABNORMAL LOW (ref >60)
GFR, EST NON AFRICAN AMERICAN: 21 mL/min — AB (ref >60)
GLUCOSE: 136 mg/dL — AB (ref 65–99)
Potassium: 4.5 mmol/L (ref 3.5–5.1)
SODIUM: 141 mmol/L (ref 135–145)

## 2016-12-09 LAB — CBC
HCT: 31.5 % — ABNORMAL LOW (ref 39.0–52.0)
Hemoglobin: 9.4 g/dL — ABNORMAL LOW (ref 13.0–17.0)
MCH: 26.4 pg (ref 26.0–34.0)
MCHC: 29.8 g/dL — AB (ref 30.0–36.0)
MCV: 88.5 fL (ref 78.0–100.0)
PLATELETS: 77 10*3/uL — AB (ref 150–400)
RBC: 3.56 MIL/uL — ABNORMAL LOW (ref 4.22–5.81)
RDW: 18.7 % — AB (ref 11.5–15.5)
WBC: 6 10*3/uL (ref 4.0–10.5)

## 2016-12-09 LAB — PROTIME-INR
INR: 1.4
Prothrombin Time: 17.3 seconds — ABNORMAL HIGH (ref 11.4–15.2)

## 2016-12-09 LAB — MAGNESIUM: MAGNESIUM: 2 mg/dL (ref 1.7–2.4)

## 2016-12-09 SURGERY — RIGHT HEART CATH

## 2016-12-09 MED ORDER — LIDOCAINE HCL (PF) 1 % IJ SOLN
INTRAMUSCULAR | Status: DC | PRN
Start: 2016-12-09 — End: 2016-12-09
  Administered 2016-12-09: 20 mL

## 2016-12-09 MED ORDER — FENTANYL CITRATE (PF) 100 MCG/2ML IJ SOLN
INTRAMUSCULAR | Status: AC
  Filled 2016-12-09: qty 2

## 2016-12-09 MED ORDER — FENTANYL CITRATE (PF) 100 MCG/2ML IJ SOLN
INTRAMUSCULAR | Status: DC | PRN
  Administered 2016-12-09: 25 ug via INTRAVENOUS

## 2016-12-09 MED ORDER — HEPARIN (PORCINE) IN NACL 2-0.9 UNIT/ML-% IJ SOLN
INTRAMUSCULAR | Status: AC
  Filled 2016-12-09: qty 1000

## 2016-12-09 MED ORDER — SODIUM CHLORIDE 0.9 % IV SOLN: INTRAVENOUS | Status: DC

## 2016-12-09 MED ORDER — SODIUM CHLORIDE 0.9 % IV SOLN: 250.0000 mL | INTRAVENOUS | Status: DC | PRN

## 2016-12-09 MED ORDER — MIDAZOLAM HCL 2 MG/2ML IJ SOLN
INTRAMUSCULAR | Status: DC | PRN
  Administered 2016-12-09: 1 mg via INTRAVENOUS

## 2016-12-09 MED ORDER — IPRATROPIUM-ALBUTEROL 0.5-2.5 (3) MG/3ML IN SOLN
3.0000 mL | Freq: Two times a day (BID) | RESPIRATORY_TRACT | Status: DC
  Administered 2016-12-09 – 2016-12-10 (×3): 3 mL via RESPIRATORY_TRACT
  Filled 2016-12-09 (×3): qty 3

## 2016-12-09 MED ORDER — LIDOCAINE HCL 1 % IJ SOLN
INTRAMUSCULAR | Status: AC
  Filled 2016-12-09: qty 20

## 2016-12-09 MED ORDER — HEPARIN (PORCINE) IN NACL 2-0.9 UNIT/ML-% IJ SOLN
INTRAMUSCULAR | Status: DC | PRN
  Administered 2016-12-09: 1000 mL

## 2016-12-09 MED ORDER — FUROSEMIDE 10 MG/ML IJ SOLN
40.0000 mg | Freq: Every day | INTRAMUSCULAR | Status: DC
  Administered 2016-12-10: 40 mg via INTRAVENOUS
  Filled 2016-12-09: qty 4

## 2016-12-09 MED ORDER — SODIUM CHLORIDE 0.9% FLUSH: 3.0000 mL | INTRAVENOUS | Status: DC | PRN

## 2016-12-09 MED ORDER — MIDAZOLAM HCL 2 MG/2ML IJ SOLN
INTRAMUSCULAR | Status: AC
  Filled 2016-12-09: qty 2

## 2016-12-09 MED ORDER — SODIUM CHLORIDE 0.9% FLUSH
3.0000 mL | Freq: Two times a day (BID) | INTRAVENOUS | Status: DC
  Administered 2016-12-09: 3 mL via INTRAVENOUS

## 2016-12-09 MED ORDER — SODIUM CHLORIDE 0.9% FLUSH
3.0000 mL | Freq: Two times a day (BID) | INTRAVENOUS | Status: DC
  Administered 2016-12-09 – 2016-12-12 (×6): 3 mL via INTRAVENOUS

## 2016-12-09 MED ORDER — ASPIRIN 81 MG PO CHEW: 81.0000 mg | CHEWABLE_TABLET | ORAL | Status: DC

## 2016-12-09 SURGICAL SUPPLY — 8 items
CATH SWAN GANZ 7F STRAIGHT (CATHETERS) ×3 IMPLANT
COVER PRB 48X5XTLSCP FOLD TPE (BAG) ×1 IMPLANT
COVER PROBE 5X48 (BAG) ×2
PROTECTION STATION PRESSURIZED (MISCELLANEOUS) ×3
SET INTRODUCER MICROPUNCT 5F (INTRODUCER) ×3 IMPLANT
SHEATH PINNACLE 7F 10CM (SHEATH) ×3 IMPLANT
STATION PROTECTION PRESSURIZED (MISCELLANEOUS) ×1 IMPLANT
WIRE EMERALD 3MM-J .025X260CM (WIRE) ×3 IMPLANT

## 2016-12-09 NOTE — Progress Notes (Signed)
   Subjective:  Patient states he is significantly improved today; he was able to ambulate with nurse to nurse's station without shortness of breath. He denies chest pain.   Objective:  Vital signs in last 24 hours: Vitals:   12/08/16 1945 12/08/16 2053 12/09/16 0556 12/09/16 0744  BP:  (!) 109/56 112/66   Pulse:  73 72   Resp:  20 20   Temp:  98.7 F (37.1 C) 98 F (36.7 C)   TempSrc:  Oral Oral   SpO2: 96% 100% 100% 100%  Weight:   66.9 kg (147 lb 8 oz)   Height:       Constitutional: NAD, sitting up in chair CV: RRR, holosystolic murmur best heard at apex, pulses intact, no pitting edema BLE - very mild sacral pitting edema, warm extremities Resp: mild wheezing, faint bibasilar crackles, no increased work of breathing Abd: soft, NDNT, +BS  Assessment/Plan:  Principal Problem:   Acute exacerbation of congestive heart failure (HCC) Active Problems:   COPD (chronic obstructive pulmonary disease) (HCC)   CAD in native artery   Thrombocytopenia (HCC)   CKD (chronic kidney disease)  Acute on chronic sCHF:  Patient with 5kg weight loss since admission; prior dry weight at 61kg. Patient reports feeling almost back to baseline and on exam appears close to euvolemic. --lasix 40mg  daily --cards to do RHC today  --daily weights; strict ins/outs --fluid restricted diet; NPO prior to RHC --PT/OT consulted --follow AM Bmet, Mag - goal K >4, Mg >2, replete as necessary  ICM, s/p CABG, s/p ICD HLD HTN Cardiology performing RHC today to help optimize fluid status. In future, may be candidate for CRT-D due to LBBB. --atorvastatin 40mg  daily, asa 81mg  daily --RHC today --start nitrates/hydroxyzine and BB in next day or so as BP allows  --cardiology following - appreciate their assistance --Home O2 - 2L qhs and PRN with activity  AKI:  Cr continues to increase to 2.62 this AM. In setting of pancytopenia and abnormal peripheral smear there is concern for MM. --hold home  lisinopril --follow Bmets --MM workup as below  COPD, on home oxygen: Wheezing significantly improved. --Breo  --duoneb q6hr --Home O2 - 2L qhs and PRN with activity  Pancytopenia:  Pt with leukopenia, anemia (normal MCV), thrombocytopenia and had a reticulocyte count of 1.1% at last admission. With his age, he is at risk for myelodysplastic syndrome. Peripheral smear shows thrombocytopenia consistent with lab counts; burr cells; slight atypical nature to PMNs that is nonspecific - suggestive of MDS or BM infiltrative process and in setting of worsening renal failure there is concern for MM.  --f/u immunofixation assay, serum light chains  Diet: HH, fluid restricted DVT ppx: SCDs Code: DNR  Dispo: Anticipated discharge in approximately 2-3 day(s).   Nyra MarketGorica Kaitelyn Jamison, MD 12/09/2016, 12:21 PM Pager [phone]

## 2016-12-09 NOTE — Progress Notes (Signed)
Subjective: [name] was sitting up in his chair this morning without his nasal cannula in and appeared to be comfortable. He said that he was feeling better and was less short of breath than he had previously been.   Objective: Vital signs in last 24 hours: Vitals:   12/08/16 2053 12/09/16 0556 12/09/16 0744 12/09/16 1226  BP: (!) 109/56 112/66  (!) 110/52  Pulse: 73 72  73  Resp: 20 20  18   Temp: 98.7 F (37.1 C) 98 F (36.7 C)  98.8 F (37.1 C)  TempSrc: Oral Oral  Oral  SpO2: 100% 100% 100% 100%  Weight:  66.9 kg (147 lb 8 oz)    Height:       Weight change: -2.676 kg (-5 lb 14.4 oz) Since admit: 5 kg    Intake/Output Summary (Last 24 hours) at 12/09/16 1227 Last data filed at 12/09/16 1210  Gross per 24 hour  Intake              500 ml  Output             4200 ml  Net            -3700 ml   Physical Exam: General: NAD  Pulm: diffuse wheezing less prominent than previous exams, no increased work of breathing  CV: No pitting edema on BLE, 1+ sacral edema, warm extremities, holosystolic murmur best appreciated at apex   Assessment/Plan: Principal Problem:   Acute exacerbation of congestive heart failure (HCC) Active Problems:   COPD (chronic obstructive pulmonary disease) (HCC)   CAD in native artery   Thrombocytopenia (HCC)   CKD (chronic kidney disease)  Acute on Chronic CHF: [name]'s shortness of breath continues to improve today and he is essentially clinically euvolemic with minimal wheezes likely consistent with his baseline COPD and no pitting edema on BLE.Marland Kitchen. Based on his volume status and rising Creatinine we should be ready to back off his diuresis. With his volume status nearing euvolemia, a right heart catheterization could now be performed to analyze pulmonary arterial pressure.  --d/c lasix --Up and out of bed and to chair as tolerated  --dailey weights  --Follow AM Bmet,Mag, goal Mg>2, K>4  AKI: [name]'s Cr rose again today to 2.62 from 2.31  yesterday. This is likely prerenal in etiology due to the ongoing diuresis and his volume overloaded status. However, the BUN/Cr ratio of 11.1 does not support a pre-renal AKI. The patient's urine output and responsiveness to diuresis suggest that a post-renal cause of AKI is not likely. Searching for potential intra-renal causes of AKI while working with the hypothesis that the acute elevation in Cr is attributable to diuresis in the setting of volume overload could ensure that alternate explanations are not missed. In addition, [name]'s CO2 was elevated to 35 this morning, which could indicate a respiratory response to a metabolic acidosis induced by lasix.  --d/c lasix    Pancytopenia: Yesterday the blood smear that was collected was analyzed and showed Burr Cells, homogenous RBCs, thrombocytopenia, and abnormal leukocytes with no specific pattern. In the setting of pancytopenia this result warrants further investigation.  --Send serum immunofixaton  --Send serum light chains   COPD: [name] was sitting up today and appeared comfortable on room air. Despite some wheezing on exam his COPD appears to be a stable problem.  -up and out of bed  --duoneb as needed  Diet: HH, fluid restricted DVT ppx: SCDs Code: DNR  This is a  Medical Student Note.  The care of the patient was discussed with Dr. Samuella Cota and the assessment and plan formulated with their assistance.  Please see their attached note for official documentation of the daily encounter.  LOS: 4 Days   Darrell Glass, Medical Student 12/09/2016, 12:27 PM

## 2016-12-09 NOTE — Progress Notes (Signed)
Site area: Rt fem venous  Site Prior to Removal:  Level 0 Pressure Applied For: Manual:  yes  Patient Status During Pull:  A/O Post Pull Site:  Level 0 Post Pull Instructions Given:  Post instructions given and pt understands Post Pull Pulses Present: 2+ rt pt Dressing Applied:  tegaderm and a 4x4 Bedrest begins @ 15:45:00 Comments: Pt leaves cath lab holding area in stable condition. Rt groin is unremarkable. Dressing is CDI.

## 2016-12-09 NOTE — H&P (View-Only) (Signed)
Patient Name: Darrell Glass Date of Encounter: 12/09/2016  Primary Cardiologist: NEW (Clair Alfieri)  Hospital Problem List     Principal Problem:   Acute exacerbation of congestive heart failure (HCC) Active Problems:   COPD (chronic obstructive pulmonary disease) (HCC)   CAD in native artery   Thrombocytopenia (HCC)   CKD (chronic kidney disease)     Subjective   Feeling better . No complaints. Slept well last night but not fully flat  Inpatient Medications    Scheduled Meds: . aspirin  81 mg Oral Daily  . atorvastatin  40 mg Oral QHS  . famotidine  20 mg Oral Daily  . finasteride  5 mg Oral QPM  . fluticasone furoate-vilanterol  1 puff Inhalation Daily  . furosemide  40 mg Intravenous BID  . ipratropium-albuterol  3 mL Nebulization BID  . mouth rinse  15 mL Mouth Rinse BID  . tamsulosin  0.4 mg Oral QPC breakfast   Continuous Infusions:  PRN Meds: ondansetron **OR** ondansetron (ZOFRAN) IV   Vital Signs    Vitals:   12/08/16 1945 12/08/16 2053 12/09/16 0556 12/09/16 0744  BP:  (!) 109/56 112/66   Pulse:  73 72   Resp:  20 20   Temp:  98.7 F (37.1 C) 98 F (36.7 C)   TempSrc:  Oral Oral   SpO2: 96% 100% 100% 100%  Weight:   147 lb 8 oz (66.9 kg)   Height:        Intake/Output Summary (Last 24 hours) at 12/09/16 1018 Last data filed at 12/09/16 0620  Gross per 24 hour  Intake              500 ml  Output             3450 ml  Net            -2950 ml   Filed Weights   12/07/16 0607 12/08/16 0404 12/09/16 0556  Weight: 157 lb 1.6 oz (71.3 kg) 153 lb 6.4 oz (69.6 kg) 147 lb 8 oz (66.9 kg)    Physical Exam    GEN: Well nourished, well developed, in no acute distress.  HEENT: Grossly normal.  Neck: Supple, 8 cm JVD, carotid bruits, or masses. Cardiac: RRR, 2-3/6 holosystolic RLSB and apical murmurs, rubs, or gallops. No clubbing, cyanosis, edema.  Radials/DP/PT 2+ and equal bilaterally.  Respiratory:  Respirations regular and unlabored, mild crackles at  bases GI: Soft, nontender, nondistended, BS + x 4. MS: no deformity or atrophy. Skin: warm and dry, no rash. Neuro:  Strength and sensation are intact. Psych: AAOx3.  Normal affect.  Labs    CBC  Recent Labs  12/08/16 0349 12/09/16 0303  WBC 7.9 6.0  HGB 9.2* 9.4*  HCT 29.4* 31.5*  MCV 87.8 88.5  PLT 71* 77*   Basic Metabolic Panel  Recent Labs  12/08/16 0349 12/09/16 0303  NA 139 141  K 3.9 4.5  CL 101 97*  CO2 28 35*  GLUCOSE 173* 136*  BUN 28* 29*  CREATININE 2.31* 2.62*  CALCIUM 9.3 9.6  MG 2.2 2.0   Liver Function Tests  Recent Labs  12/06/16 1408  AST 26  ALT 16*  ALKPHOS 105  BILITOT 2.5*  PROT 7.5  ALBUMIN 3.5     Telemetry    NSR, PVCs - Personally Reviewed  ECG    Sinus rhythm with 1st degree A-V block, LBBB - Personally Reviewed  Radiology    No results found.  Cardiac  Studies   2D ECHO: 10/06/2016 LV EF: 20% -   25% Study Conclusions - Left ventricle: The cavity size was moderately dilated. Systolic   function was severely reduced. The estimated ejection fraction   was in the range of 20% to 25%. Diffuse hypokinesis. There is   akinesis of the anteroseptal, anterior, inferoseptal, and apical   myocardium. Acoustic contrast opacification revealed no evidence   ofthrombus. - Aortic valve: Valve mobility was restricted. There was moderate   regurgitation. - Mitral valve: Severely thickened leaflets . There was moderate   regurgitation directed posteriorly. - Left atrium: The atrium was mildly dilated. - Right ventricle: The cavity size was moderately dilated. Wall   thickness was normal. Pacer wire or catheter noted in right   ventricle. - Tricuspid valve: There was mild regurgitation. - Pulmonary arteries: Systolic pressure was mildly increased. PA   peak pressure: 43 mm Hg (S).  Patient Profile     83 y.o. male recently moved from [place], with a h/o IHD/CAD with prior CABG, systolic HF with EF of 20-25%, s/p ICD, CKD, HTN,  HLD, COPD, mitral regurgitation and Pulmonary HTNwho was admitted 12/06/16 for her second episode of acute on chronic systolic HF in last 3 months.  Assessment & Plan    1. Acute on Chronic Systolic CHF: Ischemic cardiomyopathy w/ chronic systolic HF. EF 20-25%. Weight up 10 kg beyond previous dry weight when he was discharged in March. 66.9 Kg today (dry weight 61 kg). Being treated with IV Lasix, 40 mg BID. I/Os negative 5.8L. Creat starting to bump. Continue to monitor. Will plan for right heart cath today and then decide on diuretic dosing.  -- Continue strict I/Os, daily weights and low sodium diet. -- Holding spironolactone. Not a good time to start beta blocker (if we do initiate, use bisoprolol due to COPD). No ACE/ARB given CKD. Consider addition of hydralazine + nitrates as BP allows.   2. CAD:h/o CABG in [place]. No angina.  Continue medical therapy. We need to try to obtain records from [place]. Should be on ASA. He is on statin therapy with Lipitor 40. Target LDL<70.  3. ICD:we have made arrangements for remote f/u in our device clinic, also f/u in [facility]. He has never received ICD shocks.   4. Acute on Chronic Kidney Disease: baseline SCr 1.6-1.8. Admit SCr 2.11. Further increase with diuresis up to 2.62.   6. COPD:he is on home O2 and has 70 year smoking history, minimum 1 ppd, quit 2 years ago. On LABA.  7. Mitral Regurgitation:moderate by recent echo. Dr. Royann Shivers reviewed echo and thinks it is moderate to severe; looks like ischemic MR due to lateral wall motion abnormality and posterior leaflet tethering. Right heart cath as above     Signed, Cline Crock, PA-C  12/09/2016, 10:18 AM   I have seen and examined the patient along with Cline Crock, PA-C .  I have reviewed the chart, notes and new data.  I agree with PA's note.  Key new complaints: breathing has continued to improve Key examination changes: JVP less prominent, lungs are mostly  clear, distant breath sounds, minimal rales Key new findings / data: creatinine has started to rise  PLAN: He has both severe L HF due to CAD/isch CMP and R HF due to cor pulmonale.  Hard to use the physical exam alone as a guide to diuresis. I suspect he may be close to a "normal" PAWP today. Residual dyspnea could be due to  COPD. Right heart cath today to help optimize fluid status.  Thurmon FairMihai Shin Lamour, MD, [facility] HospitalFACC [facility] [phone] 12/09/2016, 10:48 AM

## 2016-12-09 NOTE — Evaluation (Signed)
Physical Therapy Evaluation Patient Details Name: Darrell Glass MRN: [id number] DOB: [birth year] Today's Date: 12/09/2016   History of Present Illness  Patient is a 83 y/o male admitted on 12/06/16 with 1 week of dyspnea on exertion, thrombocytopenia and CHF exacerbation. hx of pacemaker, CAD, COPD, CHF, CABG, HLD and PRN o2.   Clinical Impression  Pt presents with the above diagnosis and below deficits for therapy evaluation. Prior to admission, pt lived with his daughter in a single level home. Pt was able to perform a majority of mobility with use of [place], but also is using a scooter for mobility at home. Pt is, however, able to perform mobility with Min guard this session for bed mobs, transfers and gait. Pt will benefit from continued acute therapy services in order to address the below deficits prior to discharge.     Follow Up Recommendations No PT follow up    Equipment Recommendations  None recommended by PT    Recommendations for Other Services       Precautions / Restrictions Precautions Precautions: Fall Restrictions Weight Bearing Restrictions: No      Mobility  Bed Mobility Overal bed mobility: Needs Assistance Bed Mobility: Supine to Sit     Supine to sit: Min guard     General bed mobility comments: min guard for safety with cues for using rails PRN  Transfers Overall transfer level: Needs assistance Equipment used: Straight cane Transfers: Sit to/from Stand Sit to Stand: Min guard         General transfer comment: Min guard for safety from EOB  Ambulation/Gait Ambulation/Gait assistance: Min guard Ambulation Distance (Feet): 75 Feet Assistive device: Straight cane Gait Pattern/deviations: Step-through pattern;Decreased step length - right;Decreased step length - left;Narrow base of support Gait velocity: decreased Gait velocity interpretation: Below normal speed for age/gender General Gait Details: Cues for sequencing with [place]. 1 lob with cues for widening  BOS to improve safety.   Stairs            Wheelchair Mobility    Modified Rankin (Stroke Patients Only)       Balance Overall balance assessment: Needs assistance Sitting-balance support: No upper extremity supported;Feet supported Sitting balance-Leahy Scale: Good     Standing balance support: Single extremity supported Standing balance-Leahy Scale: Fair                               Pertinent Vitals/Pain Pain Assessment: No/denies pain    Home Living Family/patient expects to be discharged to:: Private residence Living Arrangements: Children;Other (Comment) Available Help at Discharge: Family;Available 24 hours/day Type of Home: House Home Access: Level entry     Home Layout: One level Home Equipment: Walker - 2 wheels;Cane - single point      Prior Function Level of Independence: Independent with assistive device(s)         Comments: Uses SPC for mobility. Does not require any assistance.  Daugther perfroms cooking, meal prep, etc. Can dress and bath himself.      Hand Dominance   Dominant Hand: Right    Extremity/Trunk Assessment   Upper Extremity Assessment Upper Extremity Assessment: Defer to OT evaluation    Lower Extremity Assessment Lower Extremity Assessment: Generalized weakness    Cervical / Trunk Assessment Cervical / Trunk Assessment: Normal  Communication   Communication: HOH  Cognition Arousal/Alertness: Awake/alert Behavior During Therapy: WFL for tasks assessed/performed Overall Cognitive Status: Within Functional Limits for tasks assessed  General Comments      Exercises     Assessment/Plan    PT Assessment Patient needs continued PT services  PT Problem List Decreased strength;Decreased activity tolerance;Decreased balance;Decreased mobility;Decreased knowledge of use of DME;Pain;Cardiopulmonary status limiting activity       PT Treatment  Interventions DME instruction;Gait training;Stair training;Functional mobility training;Therapeutic activities;Therapeutic exercise;Balance training;Patient/family education    PT Goals (Current goals can be found in the Care Plan section)  Acute Rehab PT Goals Patient Stated Goal: to get home  PT Goal Formulation: With patient Time For Goal Achievement: 12/16/16 Potential to Achieve Goals: Good    Frequency Min 3X/week   Barriers to discharge        Co-evaluation               AM-PAC PT "6 Clicks" Daily Activity  Outcome Measure Difficulty turning over in bed (including adjusting bedclothes, sheets and blankets)?: A Lot Difficulty moving from lying on back to sitting on the side of the bed? : A Lot Difficulty sitting down on and standing up from a chair with arms (e.g., wheelchair, bedside commode, etc,.)?: A Little Help needed moving to and from a bed to chair (including a wheelchair)?: A Little Help needed walking in hospital room?: A Little Help needed climbing 3-5 steps with a railing? : A Little 6 Click Score: 16    End of Session Equipment Utilized During Treatment: Gait belt Activity Tolerance: Patient tolerated treatment well Patient left: in chair;with call bell/phone within reach;with chair alarm set Nurse Communication: Mobility status PT Visit Diagnosis: Muscle weakness (generalized) (M62.81);Difficulty in walking, not elsewhere classified (R26.2)    Time: [id number] PT Time Calculation (min) (ACUTE ONLY): 24 min   Charges:   PT Evaluation $PT Eval Low Complexity: 1 Procedure PT Treatments $Gait Training: 8-22 mins   PT G Codes:        Darrell BroachSabra M. Genette Huertas PT, DPT  [phone]   Ruel FavorsSabra Aletha HalimMarie Pascual Mantel 12/09/2016, 12:58 PM

## 2016-12-09 NOTE — Progress Notes (Signed)
OT Cancellation Note  Patient Details Name: Nestor Lewandowskyra Milbourn MRN: [id number] DOB: [birth year]   Cancelled Treatment:    Reason Eval/Treat Not Completed: Patient at procedure or test/ unavailable. Pt currently in cath lab.  Evette GeorgesLeonard, Julizza Sassone Eva [phone] 12/09/2016, 2:25 PM

## 2016-12-09 NOTE — Interval H&P Note (Signed)
History and Physical Interval Note:  12/09/2016 2:22 PM  Darrell Lewandowskyra Darrell Glass  has presented today for right heart cath with the diagnosis of CHF. The various methods of treatment have been discussed with the patient and family. After consideration of risks, benefits and other options for treatment, the patient has consented to  Procedure(s): Right Heart Cath (N/A) as a surgical intervention .  The patient's history has been reviewed, patient examined, no change in status, stable for surgery.  I have reviewed the patient's chart and labs.  Questions were answered to the patient's satisfaction.       Verne Carrowhristopher Amna Welker

## 2016-12-09 NOTE — Progress Notes (Signed)
Patient with no complaints or concerns during 7pm - 7am shift. Informed patient that he needs to walk and offered to walk with the patient, however pt refused and stated that he is fine. Will continue to monitor.   Ashni Lonzo, RN

## 2016-12-09 NOTE — Progress Notes (Signed)
Internal Medicine Attending:   I saw and examined the patient. I reviewed the resident's note and I agree with the resident's findings and plan as documented in the resident's note.  83 year old man hospital day #4 with acute on chronic heart failure with reduced ejection fraction. On exam this morning he was sitting up in the chair and reported feeling much better. He diuresed down 4 kg so far this admission, but were still 5 kg away from his previously known dry weight. His lungs sound clear, extremities and sacrum are without edema. I agree with cardiology's plan for right heart catheterization today to better assess volume and pulmonary pressures.  Now has acute on chronic kidney disease with creatinine rising to 2.6 over this hospitalization. This is not prerenal azotemia given his exam. Other nephrotoxic insults this admission, no contrast, no episodes of hypotension, no recent NSAIDs that we know about. Given the association with his pancytopenia, this could represent myeloma. Will send immunofixation and light chains today to rule out a paraprotein. When able, would also obtain renal ultrasound to rule out obstructive disease.

## 2016-12-09 NOTE — Progress Notes (Signed)
Patient Name: Darrell Glass Date of Encounter: 12/09/2016  Primary Cardiologist: NEW (Dolphus Linch)  Hospital Problem List     Principal Problem:   Acute exacerbation of congestive heart failure (HCC) Active Problems:   COPD (chronic obstructive pulmonary disease) (HCC)   CAD in native artery   Thrombocytopenia (HCC)   CKD (chronic kidney disease)     Subjective   Feeling better . No complaints. Slept well last night but not fully flat  Inpatient Medications    Scheduled Meds: . aspirin  81 mg Oral Daily  . atorvastatin  40 mg Oral QHS  . famotidine  20 mg Oral Daily  . finasteride  5 mg Oral QPM  . fluticasone furoate-vilanterol  1 puff Inhalation Daily  . furosemide  40 mg Intravenous BID  . ipratropium-albuterol  3 mL Nebulization BID  . mouth rinse  15 mL Mouth Rinse BID  . tamsulosin  0.4 mg Oral QPC breakfast   Continuous Infusions:  PRN Meds: ondansetron **OR** ondansetron (ZOFRAN) IV   Vital Signs    Vitals:   12/08/16 1945 12/08/16 2053 12/09/16 0556 12/09/16 0744  BP:  (!) 109/56 112/66   Pulse:  73 72   Resp:  20 20   Temp:  98.7 F (37.1 C) 98 F (36.7 C)   TempSrc:  Oral Oral   SpO2: 96% 100% 100% 100%  Weight:   147 lb 8 oz (66.9 kg)   Height:        Intake/Output Summary (Last 24 hours) at 12/09/16 1018 Last data filed at 12/09/16 0620  Gross per 24 hour  Intake              500 ml  Output             3450 ml  Net            -2950 ml   Filed Weights   12/07/16 0607 12/08/16 0404 12/09/16 0556  Weight: 157 lb 1.6 oz (71.3 kg) 153 lb 6.4 oz (69.6 kg) 147 lb 8 oz (66.9 kg)    Physical Exam    GEN: Well nourished, well developed, in no acute distress.  HEENT: Grossly normal.  Neck: Supple, 8 cm JVD, carotid bruits, or masses. Cardiac: RRR, 2-3/6 holosystolic RLSB and apical murmurs, rubs, or gallops. No clubbing, cyanosis, edema.  Radials/DP/PT 2+ and equal bilaterally.  Respiratory:  Respirations regular and unlabored, mild crackles at  bases GI: Soft, nontender, nondistended, BS + x 4. MS: no deformity or atrophy. Skin: warm and dry, no rash. Neuro:  Strength and sensation are intact. Psych: AAOx3.  Normal affect.  Labs    CBC  Recent Labs  12/08/16 0349 12/09/16 0303  WBC 7.9 6.0  HGB 9.2* 9.4*  HCT 29.4* 31.5*  MCV 87.8 88.5  PLT 71* 77*   Basic Metabolic Panel  Recent Labs  12/08/16 0349 12/09/16 0303  NA 139 141  K 3.9 4.5  CL 101 97*  CO2 28 35*  GLUCOSE 173* 136*  BUN 28* 29*  CREATININE 2.31* 2.62*  CALCIUM 9.3 9.6  MG 2.2 2.0   Liver Function Tests  Recent Labs  12/06/16 1408  AST 26  ALT 16*  ALKPHOS 105  BILITOT 2.5*  PROT 7.5  ALBUMIN 3.5     Telemetry    NSR, PVCs - Personally Reviewed  ECG    Sinus rhythm with 1st degree A-V block, LBBB - Personally Reviewed  Radiology    No results found.  Cardiac  Studies   2D ECHO: 10/06/2016 LV EF: 20% -   25% Study Conclusions - Left ventricle: The cavity size was moderately dilated. Systolic   function was severely reduced. The estimated ejection fraction   was in the range of 20% to 25%. Diffuse hypokinesis. There is   akinesis of the anteroseptal, anterior, inferoseptal, and apical   myocardium. Acoustic contrast opacification revealed no evidence   ofthrombus. - Aortic valve: Valve mobility was restricted. There was moderate   regurgitation. - Mitral valve: Severely thickened leaflets . There was moderate   regurgitation directed posteriorly. - Left atrium: The atrium was mildly dilated. - Right ventricle: The cavity size was moderately dilated. Wall   thickness was normal. Pacer wire or catheter noted in right   ventricle. - Tricuspid valve: There was mild regurgitation. - Pulmonary arteries: Systolic pressure was mildly increased. PA   peak pressure: 43 mm Hg (S).  Patient Profile     83 y.o. male recently moved from [place], with a h/o IHD/CAD with prior CABG, systolic HF with EF of 20-25%, s/p ICD, CKD, HTN,  HLD, COPD, mitral regurgitation and Pulmonary HTNwho was admitted 12/06/16 for her second episode of acute on chronic systolic HF in last 3 months.  Assessment & Plan    1. Acute on Chronic Systolic CHF: Ischemic cardiomyopathy w/ chronic systolic HF. EF 20-25%. Weight up 10 kg beyond previous dry weight when he was discharged in March. 66.9 Kg today (dry weight 61 kg). Being treated with IV Lasix, 40 mg BID. I/Os negative 5.8L. Creat starting to bump. Continue to monitor. Will plan for right heart cath today and then decide on diuretic dosing.  -- Continue strict I/Os, daily weights and low sodium diet. -- Holding spironolactone. Not a good time to start beta blocker (if we do initiate, use bisoprolol due to COPD). No ACE/ARB given CKD. Consider addition of hydralazine + nitrates as BP allows.   2. CAD:h/o CABG in [place]. No angina.  Continue medical therapy. We need to try to obtain records from [place]. Should be on ASA. He is on statin therapy with Lipitor 40. Target LDL<70.  3. ICD:we have made arrangements for remote f/u in our device clinic, also f/u in [facility]. He has never received ICD shocks.   4. Acute on Chronic Kidney Disease: baseline SCr 1.6-1.8. Admit SCr 2.11. Further increase with diuresis up to 2.62.   6. COPD:he is on home O2 and has 70 year smoking history, minimum 1 ppd, quit 2 years ago. On LABA.  7. Mitral Regurgitation:moderate by recent echo. Dr. Royann Shivers reviewed echo and thinks it is moderate to severe; looks like ischemic MR due to lateral wall motion abnormality and posterior leaflet tethering. Right heart cath as above     Signed, Cline Crock, PA-C  12/09/2016, 10:18 AM   I have seen and examined the patient along with Cline Crock, PA-C .  I have reviewed the chart, notes and new data.  I agree with PA's note.  Key new complaints: breathing has continued to improve Key examination changes: JVP less prominent, lungs are mostly  clear, distant breath sounds, minimal rales Key new findings / data: creatinine has started to rise  PLAN: He has both severe L HF due to CAD/isch CMP and R HF due to cor pulmonale.  Hard to use the physical exam alone as a guide to diuresis. I suspect he may be close to a "normal" PAWP today. Residual dyspnea could be due to  COPD. Right heart cath today to help optimize fluid status.  Thurmon FairMihai Zela Sobieski, MD, [facility] [phone] 12/09/2016, 10:48 AM

## 2016-12-10 LAB — CBC
HCT: 32.1 % — ABNORMAL LOW (ref 39.0–52.0)
HEMOGLOBIN: 10 g/dL — AB (ref 13.0–17.0)
MCH: 26.9 pg (ref 26.0–34.0)
MCHC: 31.2 g/dL (ref 30.0–36.0)
MCV: 86.3 fL (ref 78.0–100.0)
PLATELETS: 97 10*3/uL — AB (ref 150–400)
RBC: 3.72 MIL/uL — AB (ref 4.22–5.81)
RDW: 18.4 % — ABNORMAL HIGH (ref 11.5–15.5)
WBC: 5.3 10*3/uL (ref 4.0–10.5)

## 2016-12-10 LAB — BASIC METABOLIC PANEL
ANION GAP: 10 (ref 5–15)
BUN: 29 mg/dL — ABNORMAL HIGH (ref 6–20)
CO2: 32 mmol/L (ref 22–32)
Calcium: 9.5 mg/dL (ref 8.9–10.3)
Chloride: 95 mmol/L — ABNORMAL LOW (ref 101–111)
Creatinine, Ser: 2.24 mg/dL — ABNORMAL HIGH (ref 0.61–1.24)
GFR calc non Af Amer: 26 mL/min — ABNORMAL LOW (ref >60)
GFR, EST AFRICAN AMERICAN: 30 mL/min — AB (ref >60)
Glucose, Bld: 111 mg/dL — ABNORMAL HIGH (ref 65–99)
Potassium: 3.8 mmol/L (ref 3.5–5.1)
SODIUM: 137 mmol/L (ref 135–145)

## 2016-12-10 LAB — MAGNESIUM: Magnesium: 2.2 mg/dL (ref 1.7–2.4)

## 2016-12-10 MED ORDER — POTASSIUM CHLORIDE CRYS ER 20 MEQ PO TBCR
40.0000 meq | EXTENDED_RELEASE_TABLET | Freq: Two times a day (BID) | ORAL | Status: AC
  Administered 2016-12-10 (×2): 40 meq via ORAL
  Filled 2016-12-10 (×2): qty 2

## 2016-12-10 MED ORDER — SENNOSIDES-DOCUSATE SODIUM 8.6-50 MG PO TABS
1.0000 | ORAL_TABLET | Freq: Once | ORAL | Status: AC
  Administered 2016-12-10: 1 via ORAL
  Filled 2016-12-10: qty 1

## 2016-12-10 MED ORDER — FUROSEMIDE 10 MG/ML IJ SOLN
40.0000 mg | Freq: Two times a day (BID) | INTRAMUSCULAR | Status: DC
  Administered 2016-12-10 – 2016-12-11 (×2): 40 mg via INTRAVENOUS
  Filled 2016-12-10 (×2): qty 4

## 2016-12-10 NOTE — Evaluation (Signed)
Occupational Therapy Evaluation Patient Details Name: Darrell Glass MRN: [id number] DOB: 1933-04-23 Today's Date: 12/10/2016    History of Present Illness Patient is a 83 y/o male admitted on 12/06/16 with 1 week of dyspnea on exertion, thrombocytopenia and CHF exacerbation. hx of pacemaker, CAD, COPD, CHF, CABG, HLD and PRN o2.    Clinical Impression   PT admitted with CHF exacerbation. Pt currently with functional limitiations due to the deficits listed below (see OT problem list). PTA daughter helped with tub transfers and needs extended time due to fatigue. Pt requires min guard at this time for basic adls.  Pt will benefit from skilled OT to increase their independence and safety with adls and balance to allow discharge HHOT and education completed this session on energy conservation. Next session to focus on tub transfer.     Follow Up Recommendations  Home health OT    Equipment Recommendations  None recommended by OT    Recommendations for Other Services       Precautions / Restrictions Precautions Precautions: Fall      Mobility Bed Mobility Overal bed mobility: Modified Independent                Transfers Overall transfer level: Needs assistance Equipment used: Straight cane Transfers: Sit to/from Stand Sit to Stand: Min guard              Balance                                           ADL either performed or assessed with clinical judgement   ADL Overall ADL's : Needs assistance/impaired Eating/Feeding: Independent   Grooming: Supervision/safety   Upper Body Bathing: Supervision/ safety   Lower Body Bathing: Minimal assistance           Toilet Transfer: Min guard     Toileting - Clothing Manipulation Details (indicate cue type and reason): pt with smears of stool on the bed and lack of awareness pt reports constipation and taking medication currently to attempt to relieve     Functional mobility during ADLs: Min  guard General ADL Comments: pt educated on energy conservation and techniques to prevent fatigue during the day. pt repeating it back to therapist as teach back. pt uses a cane and when at grocery store uses electric cart. Pt has a scooter he uses when in the community      Vision Baseline Vision/History: Wears glasses Wears Glasses: At all times       Perception     Praxis      Pertinent Vitals/Pain Pain Assessment: No/denies pain     Hand Dominance Right   Extremity/Trunk Assessment Upper Extremity Assessment Upper Extremity Assessment: Generalized weakness   Lower Extremity Assessment Lower Extremity Assessment: Defer to PT evaluation   Cervical / Trunk Assessment Cervical / Trunk Assessment: Normal   Communication Communication Communication: HOH   Cognition Arousal/Alertness: Awake/alert Behavior During Therapy: WFL for tasks assessed/performed Overall Cognitive Status: Within Functional Limits for tasks assessed                                     General Comments       Exercises     Shoulder Instructions      Home Living Family/patient expects to be discharged to:: Private residence  Living Arrangements: Children;Other (Comment) Available Help at Discharge: Family;Available 24 hours/day Type of Home: House Home Access: Level entry     Home Layout: One level     Bathroom Shower/Tub: Chief Strategy Officer: Standard     Home Equipment: Environmental consultant - 2 wheels;Cane - single point          Prior Functioning/Environment Level of Independence: Independent with assistive device(s)    ADL's / Homemaking Assistance Needed: daughter helps patient get down into the tub and out   Comments: Uses SPC for mobility. Does not require any assistance.  Daugther perfroms cooking, meal prep, etc. Can dress and bath himself.         OT Problem List: Decreased strength;Decreased range of motion;Decreased activity tolerance;Impaired  balance (sitting and/or standing);Decreased safety awareness;Decreased knowledge of use of DME or AE;Decreased knowledge of precautions      OT Treatment/Interventions: Self-care/ADL training;Therapeutic exercise;DME and/or AE instruction;Energy conservation;Therapeutic activities;Patient/family education;Balance training    OT Goals(Current goals can be found in the care plan section) Acute Rehab OT Goals Patient Stated Goal: to get home  OT Goal Formulation: With patient Time For Goal Achievement: 12/24/16  OT Frequency: Min 2X/week   Barriers to D/C:            Co-evaluation              AM-PAC PT "6 Clicks" Daily Activity     Outcome Measure Help from another person eating meals?: None Help from another person taking care of personal grooming?: None Help from another person toileting, which includes using toliet, bedpan, or urinal?: None Help from another person bathing (including washing, rinsing, drying)?: None Help from another person to put on and taking off regular upper body clothing?: None Help from another person to put on and taking off regular lower body clothing?: A Little 6 Click Score: 23   End of Session Equipment Utilized During Treatment: Other (comment) (cane) Nurse Communication: Mobility status;Precautions  Activity Tolerance: Patient tolerated treatment well Patient left: in bed;with call bell/phone within reach  OT Visit Diagnosis: Unsteadiness on feet (R26.81)                Time: 1425-1440 OT Time Calculation (min): 15 min Charges:  OT General Charges $OT Visit: 1 Procedure OT Evaluation $OT Eval Moderate Complexity: 1 Procedure G-Codes:      Mateo Flow   OTR/L Pager: [phone] Office: [phone] .   Boone Master B 12/10/2016, 2:46 PM

## 2016-12-10 NOTE — Progress Notes (Addendum)
Pt walked in a hallway, vitals stable, has good urine output, no any complaint of chest pain and SOB, given Senna kot per MD order and has BM after 4 days today, will continue to monitor the patient

## 2016-12-10 NOTE — Progress Notes (Signed)
  Subjective: [name] was sitting up in bed this morning and stated that he is ready to go home. He mentioned that his shortness of breath continues to improve. He did mention that he feels weaker than usual, which he attributed to staying in bed in the hospital for several days. He was able to walk in the hallway with the assistance of his nurse yesterday. In addition, [name] endorsed constipation and lack of bowel movements in the last several days.   Objective: Vital signs in last 24 hours: Vitals:   12/10/16 0039 12/10/16 0452 12/10/16 0917 12/10/16 0918  BP: (!) 118/52 112/66    Pulse: 79 83    Resp: 18 18    Temp: 98.4 F (36.9 C) 98.8 F (37.1 C)    TempSrc: Tympanic Oral    SpO2: 97% 96% 94% 94%  Weight:  66 kg (145 lb 9.6 oz)    Height:       Weight Change: Since Admission: 5.9 kg, Since Last Weight: 0.9 kg   Intake/Output Summary (Last 24 hours) at 12/10/16 1130 Last data filed at 12/10/16 0900  Gross per 24 hour  Intake              720 ml  Output             1850 ml  Net            -1130 ml   Physical Exam:  General: NAD  Pulm: CAB  CV: No pitting edema on BLE, warm extremities, holosystolic murmur best appreciated at apex   Assessment/Plan: Principal Problem:   Acute exacerbation of congestive heart failure (HCC) Active Problems:   COPD (chronic obstructive pulmonary disease) (HCC)   CAD in native artery   Thrombocytopenia (HCC)   CKD (chronic kidney disease)   Acute on chronic systolic CHF (congestive heart failure) (HCC)  Acute on Chronic Heart Failure: Today Mr. Vermette appeared clinically eurvolemic and was having minimal shortness of breath. His weight is still 5 kg elevated over his dry weight reported during his last hospitalization. He is receiving furosemide for continued diuresis and should be counseled on the importance of taking this medication for symptom control as an outpatient. In addition he received a left heart catheterization that  revealed elevated pulmonary capillary wedge pressure. Cardiology thus, recommended further diuresis. --Lasix 40 mg BID --Consider beginning Hydralazine and Nitrates as BP allows per-cardiology   AKI: [name]'s Creatine was improved today, dropping to 2.24 from 2.62 yesterday. This could provide evidence that the elevation in creatinine was in the setting of fluid overload and not intrinsic renal injury. --monitor Cr while diuresing   Constipation: [name] was complaining of constipation this morning. This is consistent with his age and stay in the hospital.  --consider Senna-Docusate   Pancytopenia: Pancytopenia concerning for process affecting bone marrow. We have sent kappa and lambda light chains as well as immunofixation electrophoresis to rule out myeloma and are waiting on the laboratory results.  COPD:  [name] was sitting up today and appeared comfortable on room air.  --up and out of bed  --duoneb as needed   This is a Psychologist, occupationalMedical Student Note.  The care of the patient was discussed with Dr. Samuella CotaSvalina and the assessment and plan formulated with their assistance.  Please see their attached note for official documentation of the daily encounter.   LOS: 3 days   Myra Rudeunningham, Paton Crum, Medical Student 12/10/2016, 11:30 AM

## 2016-12-10 NOTE — Progress Notes (Signed)
Patient Name: Darrell Glass Date of Encounter: 12/10/2016  Primary Cardiologist: NEW (Croitoru)  Hospital Problem List     Principal Problem:   Acute exacerbation of congestive heart failure (HCC) Active Problems:   COPD (chronic obstructive pulmonary disease) (HCC)   CAD in native artery   Thrombocytopenia (HCC)   CKD (chronic kidney disease)   Acute on chronic systolic CHF (congestive heart failure) (HCC)     Subjective   Right heart cath yesterday shows CO 5/CI 2.8, PCWP 21.  Inpatient Medications    Scheduled Meds: . aspirin  81 mg Oral Daily  . atorvastatin  40 mg Oral QHS  . famotidine  20 mg Oral Daily  . finasteride  5 mg Oral QPM  . fluticasone furoate-vilanterol  1 puff Inhalation Daily  . furosemide  40 mg Intravenous Daily  . ipratropium-albuterol  3 mL Nebulization BID  . mouth rinse  15 mL Mouth Rinse BID  . potassium chloride  40 mEq Oral BID  . sodium chloride flush  3 mL Intravenous Q12H  . tamsulosin  0.4 mg Oral QPC breakfast   Continuous Infusions: . sodium chloride     PRN Meds: sodium chloride, ondansetron **OR** ondansetron (ZOFRAN) IV, sodium chloride flush   Vital Signs    Vitals:   12/10/16 0039 12/10/16 0452 12/10/16 0917 12/10/16 0918  BP: (!) 118/52 112/66    Pulse: 79 83    Resp: 18 18    Temp: 98.4 F (36.9 C) 98.8 F (37.1 C)    TempSrc: Tympanic Oral    SpO2: 97% 96% 94% 94%  Weight:  145 lb 9.6 oz (66 kg)    Height:        Intake/Output Summary (Last 24 hours) at 12/10/16 1054 Last data filed at 12/10/16 0900  Gross per 24 hour  Intake              720 ml  Output             1850 ml  Net            -1130 ml   Filed Weights   12/09/16 0556 12/09/16 1258 12/10/16 0452  Weight: 147 lb 8 oz (66.9 kg) 147 lb 7.8 oz (66.9 kg) 145 lb 9.6 oz (66 kg)    Physical Exam    GEN: Well nourished, well developed, in no acute distress.  HEENT: Grossly normal.  Neck: Supple, 8 cm JVD, carotid bruits, or masses. Cardiac: RRR,  2-3/6 holosystolic RLSB and apical murmurs, rubs, or gallops. No clubbing, cyanosis, edema.  Radials/DP/PT 2+ and equal bilaterally.  Respiratory:  Respirations regular and unlabored, mild crackles at bases GI: Soft, nontender, nondistended, BS + x 4. MS: no deformity or atrophy. Skin: warm and dry, no rash. Neuro:  Strength and sensation are intact. Psych: AAOx3.  Normal affect.  Labs    CBC  Recent Labs  12/09/16 0303 12/10/16 0211  WBC 6.0 5.3  HGB 9.4* 10.0*  HCT 31.5* 32.1*  MCV 88.5 86.3  PLT 77* 97*   Basic Metabolic Panel  Recent Labs  12/09/16 0303 12/10/16 0211  NA 141 137  K 4.5 3.8  CL 97* 95*  CO2 35* 32  GLUCOSE 136* 111*  BUN 29* 29*  CREATININE 2.62* 2.24*  CALCIUM 9.6 9.5  MG 2.0 2.2   Liver Function Tests No results for input(s): AST, ALT, ALKPHOS, BILITOT, PROT, ALBUMIN in the last 72 hours.   Telemetry    NSR, PVCs - Personally  Reviewed  ECG    Sinus rhythm with 1st degree A-V block, LBBB - Personally Reviewed  Radiology    No results found.  Cardiac Studies   2D ECHO: 10/06/2016 LV EF: 20% -   25% Study Conclusions - Left ventricle: The cavity size was moderately dilated. Systolic   function was severely reduced. The estimated ejection fraction   was in the range of 20% to 25%. Diffuse hypokinesis. There is   akinesis of the anteroseptal, anterior, inferoseptal, and apical   myocardium. Acoustic contrast opacification revealed no evidence   ofthrombus. - Aortic valve: Valve mobility was restricted. There was moderate   regurgitation. - Mitral valve: Severely thickened leaflets . There was moderate   regurgitation directed posteriorly. - Left atrium: The atrium was mildly dilated. - Right ventricle: The cavity size was moderately dilated. Wall   thickness was normal. Pacer wire or catheter noted in right   ventricle. - Tricuspid valve: There was mild regurgitation. - Pulmonary arteries: Systolic pressure was mildly  increased. PA   peak pressure: 43 mm Hg (S).  Patient Profile     83 y.o. male recently moved from [place], with a h/o IHD/CAD with prior CABG, systolic HF with EF of 20-25%, s/p ICD, CKD, HTN, HLD, COPD, mitral regurgitation and Pulmonary HTNwho was admitted 12/06/16 for her second episode of acute on chronic systolic HF in last 3 months.  Assessment & Plan    1. Acute on Chronic Systolic CHF: Ischemic cardiomyopathy w/ chronic systolic HF. EF 20-25%. Weight up 10 kg beyond previous dry weight when he was discharged in March. 66.9 Kg today (dry weight 61 kg). Being treated with IV Lasix, 40 mg BID. I/Os negative 5.8L. Creat starting to bump. Continue to monitor. Will plan for right heart cath today and then decide on diuretic dosing.  -- Continue strict I/Os, daily weights and low sodium diet. -- Holding spironolactone. Not a good time to start beta blocker (if we do initiate, use bisoprolol due to COPD). No ACE/ARB given CKD. Consider addition of hydralazine + nitrates as BP allows. PCWP elevated, increase IV lasix to BID.  2. CAD:h/o CABG in [place]. No angina.  Continue medical therapy. We need to try to obtain records from [place]. Should be on ASA. He is on statin therapy with Lipitor 40. Target LDL<70.  3. ICD:we have made arrangements for remote f/u in our device clinic, also f/u in [facility]. He has never received ICD shocks.   4. Acute on Chronic Kidney Disease: baseline SCr 1.6-1.8. Admit SCr 2.11. Creatinine improved with diuresis overnight - PCWP elevated. Continue diuresis.  6. COPD:he is on home O2 and has 70 year smoking history, minimum 1 ppd, quit 2 years ago. On LABA.  7. Mitral Regurgitation:moderate by recent echo. Dr. Royann Shivers reviewed echo and thinks it is moderate to severe; looks like ischemic MR due to lateral wall motion abnormality and posterior leaflet tethering. Right heart cath as above     Chrystie Nose, MD, [facility]  [place]  [facility]   Attending Cardiologist  Direct Dial: [phone]  Fax: [phone]  Website:  [email or web address]  Chrystie Nose, MD  12/10/2016, 10:54 AM

## 2016-12-10 NOTE — Progress Notes (Signed)
   Subjective:  Patient states he feels ready to go home; he still has some shortness of breath intermittently.   Objective:  Vital signs in last 24 hours: Vitals:   12/09/16 1700 12/09/16 2018 12/10/16 0039 12/10/16 0452  BP: (!) 121/47 107/62 (!) 118/52 112/66  Pulse: 75 79 79 83  Resp:  18 18 18   Temp:  99.2 F (37.3 C) 98.4 F (36.9 C) 98.8 F (37.1 C)  TempSrc:  Oral Tympanic Oral  SpO2:  95% 97% 96%  Weight:    66 kg (145 lb 9.6 oz)  Height:       Constitutional: NAD CV: RRR, holosystolic murmur best heard at apex, pulses intact, minimal pitting edema BLE, warm extremities Resp: CTAB, no increased work of breathing Abd: soft, NDNT, +BS  Assessment/Plan:  Principal Problem:   Acute exacerbation of congestive heart failure (HCC) Active Problems:   COPD (chronic obstructive pulmonary disease) (HCC)   CAD in native artery   Thrombocytopenia (HCC)   CKD (chronic kidney disease)   Acute on chronic systolic CHF (congestive heart failure) (HCC)  Acute on chronic sCHF:  Patient with 6kg weight loss since admission; has about 5kg more to go to reach prior dry weight at 61kg. On exam he appears euvolemic, though RHC showed elevated PCWP.  --lasix 40mg  daily  --daily weights; strict ins/outs --fluid restricted diet --follow AM Bmet, Mag - goal K >4, Mg >2, replete as necessary  ICM, s/p CABG, s/p ICD HLD HTN RHC reveals elevated PCWP. --atorvastatin 40mg  daily, asa 81mg  daily --start nitrates/hydralazine and BB in next day or so as BP allows  --cardiology following - appreciate their assistance --Home O2 - 2L qhs and PRN with activity  AKI:  Cr 2.24 this AM - improving; b/l 1.9. In setting of pancytopenia and abnormal peripheral smear there is concern for MM. --hold home lisinopril --follow Bmets --MM workup as below  COPD, on home oxygen: Stable. --Breo  --duoneb q6hr --Home O2 - 2L qhs and PRN with activity  Pancytopenia:  Pt with leukopenia, anemia  (normal MCV), thrombocytopenia and had a reticulocyte count of 1.1% at last admission. With his age, he is at risk for myelodysplastic syndrome. Peripheral smear shows thrombocytopenia consistent with lab counts; burr cells; slight atypical nature to PMNs that is nonspecific - suggestive of MDS or BM infiltrative process and in setting of worsening renal failure there is concern for MM.  --f/u immunofixation assay, serum light chains  Diet: HH, fluid restricted DVT ppx: SCDs Code: DNR  Dispo: Anticipated discharge in approximately 1-2 day(s).   [name], Arabelle Bollig, MD 12/10/2016, 7:28 AM Pager [phone]

## 2016-12-11 ENCOUNTER — Inpatient Hospital Stay (HOSPITAL_COMMUNITY): Payer: PPO

## 2016-12-11 DIAGNOSIS — Z7982 Long term (current) use of aspirin: Secondary | ICD-10-CM

## 2016-12-11 DIAGNOSIS — Z9981 Dependence on supplemental oxygen: Secondary | ICD-10-CM

## 2016-12-11 DIAGNOSIS — Z95810 Presence of automatic (implantable) cardiac defibrillator: Secondary | ICD-10-CM

## 2016-12-11 DIAGNOSIS — J449 Chronic obstructive pulmonary disease, unspecified: Secondary | ICD-10-CM

## 2016-12-11 DIAGNOSIS — Z7951 Long term (current) use of inhaled steroids: Secondary | ICD-10-CM

## 2016-12-11 DIAGNOSIS — I110 Hypertensive heart disease with heart failure: Secondary | ICD-10-CM

## 2016-12-11 DIAGNOSIS — D61818 Other pancytopenia: Secondary | ICD-10-CM

## 2016-12-11 DIAGNOSIS — Z951 Presence of aortocoronary bypass graft: Secondary | ICD-10-CM

## 2016-12-11 DIAGNOSIS — Z8679 Personal history of other diseases of the circulatory system: Secondary | ICD-10-CM

## 2016-12-11 DIAGNOSIS — E785 Hyperlipidemia, unspecified: Secondary | ICD-10-CM

## 2016-12-11 LAB — CBC
HCT: 36 % — ABNORMAL LOW (ref 39.0–52.0)
Hemoglobin: 11.2 g/dL — ABNORMAL LOW (ref 13.0–17.0)
MCH: 26.5 pg (ref 26.0–34.0)
MCHC: 31.1 g/dL (ref 30.0–36.0)
MCV: 85.1 fL (ref 78.0–100.0)
Platelets: 129 10*3/uL — ABNORMAL LOW (ref 150–400)
RBC: 4.23 MIL/uL (ref 4.22–5.81)
RDW: 17.9 % — AB (ref 11.5–15.5)
WBC: 6.5 10*3/uL (ref 4.0–10.5)

## 2016-12-11 LAB — MAGNESIUM: Magnesium: 2.3 mg/dL (ref 1.7–2.4)

## 2016-12-11 LAB — BASIC METABOLIC PANEL
Anion gap: 11 (ref 5–15)
BUN: 34 mg/dL — AB (ref 6–20)
CHLORIDE: 95 mmol/L — AB (ref 101–111)
CO2: 31 mmol/L (ref 22–32)
CREATININE: 2.52 mg/dL — AB (ref 0.61–1.24)
Calcium: 9.8 mg/dL (ref 8.9–10.3)
GFR calc Af Amer: 26 mL/min — ABNORMAL LOW (ref >60)
GFR calc non Af Amer: 22 mL/min — ABNORMAL LOW (ref >60)
GLUCOSE: 142 mg/dL — AB (ref 65–99)
POTASSIUM: 4.4 mmol/L (ref 3.5–5.1)
SODIUM: 137 mmol/L (ref 135–145)

## 2016-12-11 MED ORDER — IPRATROPIUM-ALBUTEROL 0.5-2.5 (3) MG/3ML IN SOLN: 3.0000 mL | Freq: Four times a day (QID) | RESPIRATORY_TRACT | Status: DC | PRN

## 2016-12-11 MED ORDER — FUROSEMIDE 40 MG PO TABS
60.0000 mg | ORAL_TABLET | Freq: Every day | ORAL | Status: DC
  Administered 2016-12-12: 10:00:00 60 mg via ORAL
  Filled 2016-12-11: qty 1

## 2016-12-11 NOTE — Progress Notes (Signed)
   Subjective:  Patient has no complaints this morning. He states that he walked down the hall yesterday and did not feel fatigue, shortness of breath or chest pain. He has been reclining back farther when sleeping - almost flat.   Objective:  Vital signs in last 24 hours: Vitals:   12/10/16 2113 12/11/16 0541 12/11/16 0745 12/11/16 1230  BP:  101/78  126/71  Pulse:  96  88  Resp:  18  18  Temp:  98.7 F (37.1 C)  98.5 F (36.9 C)  TempSrc:  Oral  Oral  SpO2: 98% 98% 96% 97%  Weight:  62.4 kg (137 lb 9.6 oz)    Height:       Constitutional: NAD CV: RRR, holosystolic murmur best heard at apex, pulses intact, minimal pitting edema BLE, warm extremities Resp: CTAB, no increased work of breathing Abd: soft, NDNT, +BS  Assessment/Plan:  Principal Problem:   Acute exacerbation of congestive heart failure (HCC) Active Problems:   COPD (chronic obstructive pulmonary disease) (HCC)   CAD in native artery   Thrombocytopenia (HCC)   CKD (chronic kidney disease)   Acute on chronic systolic CHF (congestive heart failure) (HCC)  Acute on chronic sCHF:  Weight 62.4 today; dry weight at 61kg. On exam he appears euvolemic. Cardiology following and recommend stopping IV lasix today and resuming home dose tomorrow, which is reasonable.  --stop IV lasix, got one dose this AM --lasix 60mg  PO daily starting 5/7 --daily weights; strict ins/outs --fluid restricted diet --follow AM Bmet, Mag - goal K >4, Mg >2, replete as necessary  ICM, s/p CABG, s/p ICD HLD HTN: --atorvastatin 40mg  daily, asa 81mg  daily --start nitrates/hydralazine as BP allows - still low-normal currently --cardiology following - appreciate their assistance --Home O2 - 2L qhs and PRN with activity  AKI:  Cr worsening after increase in lasix yesterday, 2.6 today; b/l 1.9. In setting of pancytopenia and abnormal peripheral smear there is concern for MM. --hold home lisinopril --d/c IV lasix, restart home PO lasix  tomorrow --follow Bmets --MM workup as below --renal U/S to look for obstruction  COPD, on home oxygen: Stable. --Breo  --duoneb q6hr, PRN --Home O2 - 2L qhs and PRN with activity  Pancytopenia:  Pt with leukopenia, anemia (normal MCV), thrombocytopenia and had a reticulocyte count of 1.1% at last admission. With his age, he is at risk for myelodysplastic syndrome. Peripheral smear shows thrombocytopenia consistent with lab counts; burr cells; slight atypical nature to PMNs that is nonspecific - suggestive of MDS or BM infiltrative process and in setting of worsening renal failure there is concern for MM.  --f/u immunofixation assay, serum light chains  Diet: HH, fluid restricted DVT ppx: SCDs Code: DNR  Dispo: Anticipated discharge in approximately 1-2 day(s).   [name], Meika Earll, MD 12/11/2016, 3:31 PM Pager [phone]

## 2016-12-11 NOTE — Progress Notes (Signed)
  Date: 12/11/2016  Patient name: Darrell Glass  Medical record number: [id number]  Date of birth: 08/26/1932   I have seen and evaluated this patient and I have discussed the plan of care with the house staff. Please see their note for complete details. I concur with their findings with the following additions/corrections: Mr. Hagos was seen on morning rounds. He was able to speak in full sentences without respiratory distress. He had slight crackles in the L base. His Cr has increased from 2.24 - 2.52, wt net -7lbs. I/O 24 hrs -3.6, net I/) -10.8L. cardiology recommends stopping the IV Lasix and resuming oral Lasix tomorrow. Multiple myeloma workup is pending.  Bartholomew Crews, MD 12/11/2016, 12:17 PM

## 2016-12-11 NOTE — Progress Notes (Signed)
Patient Name: Darrell Glass Date of Encounter: 12/11/2016  Primary Cardiologist: NEW (Croitoru)  Hospital Problem List     Principal Problem:   Acute exacerbation of congestive heart failure (HCC) Active Problems:   COPD (chronic obstructive pulmonary disease) (HCC)   CAD in native artery   Thrombocytopenia (HCC)   CKD (chronic kidney disease)   Acute on chronic systolic CHF (congestive heart failure) (HCC)    Subjective   Significant diuresis overnight - creatinine now rising. Denies shortness of breath today. Lasix held last night and d/c'd today.   Inpatient Medications    Scheduled Meds: . aspirin  81 mg Oral Daily  . atorvastatin  40 mg Oral QHS  . famotidine  20 mg Oral Daily  . finasteride  5 mg Oral QPM  . fluticasone furoate-vilanterol  1 puff Inhalation Daily  . furosemide  40 mg Intravenous BID  . mouth rinse  15 mL Mouth Rinse BID  . sodium chloride flush  3 mL Intravenous Q12H  . tamsulosin  0.4 mg Oral QPC breakfast   Continuous Infusions: . sodium chloride     PRN Meds: sodium chloride, ipratropium-albuterol, ondansetron **OR** ondansetron (ZOFRAN) IV, sodium chloride flush   Vital Signs    Vitals:   12/10/16 1953 12/10/16 2113 12/11/16 0541 12/11/16 0745  BP: 122/67  101/78   Pulse: 84  96   Resp: 18  18   Temp: 99.6 F (37.6 C)  98.7 F (37.1 C)   TempSrc: Oral  Oral   SpO2: 98% 98% 98% 96%  Weight:   137 lb 9.6 oz (62.4 kg)   Height:        Intake/Output Summary (Last 24 hours) at 12/11/16 1118 Last data filed at 12/11/16 0600  Gross per 24 hour  Intake              820 ml  Output             3226 ml  Net            -2406 ml   Filed Weights   12/09/16 1258 12/10/16 0452 12/11/16 0541  Weight: 147 lb 7.8 oz (66.9 kg) 145 lb 9.6 oz (66 kg) 137 lb 9.6 oz (62.4 kg)    Physical Exam    GEN: Well nourished, well developed, in no acute distress.  HEENT: Grossly normal.  Neck: Supple, 5 cm JVD, carotid bruits, or masses. Cardiac: RRR,  2-3/6 holosystolic RLSB and apical murmurs, rubs, or gallops. No clubbing, cyanosis, edema.  Radials/DP/PT 2+ and equal bilaterally.  Respiratory:  Lungs clear GI: Soft, nontender, nondistended, BS + x 4. MS: no deformity or atrophy. Skin: warm and dry, no rash. Neuro:  Strength and sensation are intact. Psych: AAOx3.  Normal affect.  Labs    CBC  Recent Labs  12/10/16 0211 12/11/16 0326  WBC 5.3 6.5  HGB 10.0* 11.2*  HCT 32.1* 36.0*  MCV 86.3 85.1  PLT 97* 129*   Basic Metabolic Panel  Recent Labs  12/10/16 0211 12/11/16 0326  NA 137 137  K 3.8 4.4  CL 95* 95*  CO2 32 31  GLUCOSE 111* 142*  BUN 29* 34*  CREATININE 2.24* 2.52*  CALCIUM 9.5 9.8  MG 2.2 2.3   Liver Function Tests No results for input(s): AST, ALT, ALKPHOS, BILITOT, PROT, ALBUMIN in the last 72 hours.   Telemetry    NSR, PVCs - Personally Reviewed  ECG    Sinus rhythm with 1st degree A-V block, LBBB -  Personally Reviewed  Radiology    No results found.  Cardiac Studies   2D ECHO: 10/06/2016 LV EF: 20% -   25% Study Conclusions - Left ventricle: The cavity size was moderately dilated. Systolic   function was severely reduced. The estimated ejection fraction   was in the range of 20% to 25%. Diffuse hypokinesis. There is   akinesis of the anteroseptal, anterior, inferoseptal, and apical   myocardium. Acoustic contrast opacification revealed no evidence   ofthrombus. - Aortic valve: Valve mobility was restricted. There was moderate   regurgitation. - Mitral valve: Severely thickened leaflets . There was moderate   regurgitation directed posteriorly. - Left atrium: The atrium was mildly dilated. - Right ventricle: The cavity size was moderately dilated. Wall   thickness was normal. Pacer wire or catheter noted in right   ventricle. - Tricuspid valve: There was mild regurgitation. - Pulmonary arteries: Systolic pressure was mildly increased. PA   peak pressure: 43 mm Hg  (S).  Patient Profile     83 y.o. male recently moved from [place], with a h/o IHD/CAD with prior CABG, systolic HF with EF of 20-25%, s/p ICD, CKD, HTN, HLD, COPD, mitral regurgitation and Pulmonary HTNwho was admitted 12/06/16 for her second episode of acute on chronic systolic HF in last 3 months.  Assessment & Plan    1. Acute on Chronic Systolic CHF: Ischemic cardiomyopathy w/ chronic systolic HF. EF 20-25%. Weight up 10 kg beyond previous dry weight when he was discharged in March. 66.9 Kg today (dry weight 61 kg). Being treated with IV Lasix, 40 mg BID. I/Os negative 5.8L. Creat starting to bump. Continue to monitor. Will plan for right heart cath today and then decide on diuretic dosing.  -- Continue strict I/Os, daily weights and low sodium diet. -- Holding spironolactone. Not a good time to start beta blocker (if we do initiate, use bisoprolol due to COPD). No ACE/ARB given CKD. Consider addition of hydralazine + nitrates as BP allows. Marked urine output overnight - 3L negative - lasix discontinued. Would resume oral lasix tomorrow.  2. CAD:h/o CABG in [place]. No angina.  Continue medical therapy. We need to try to obtain records from [place]. Should be on ASA. He is on statin therapy with Lipitor 40. Target LDL<70.  3. ICD:we have made arrangements for remote f/u in our device clinic, also f/u in [facility]. He has never received ICD shocks.   4. Acute on Chronic Kidney Disease: baseline SCr 1.6-1.8. Admit SCr 2.11. Creatinine increase overnight, likely secondary to aggressive diuresis. Hold further IV lasix and transition to po lasix.  6. COPD:he is on home O2 and has 70 year smoking history, minimum 1 ppd, quit 2 years ago. On LABA.  7. Mitral Regurgitation:moderate by recent echo. Dr. Royann Shiversroitoru reviewed echo and thinks it is moderate to severe; looks like ischemic MR due to lateral wall motion abnormality and posterior leaflet tethering. Right heart cath as above      Chrystie NoseKenneth C. Hilty, MD, [facility] HospitalFACC  [place]  [facility]  Attending Cardiologist  Direct Dial: [phone]  Fax: [phone]  Website:  [email or web address]  Chrystie NoseKenneth C Hilty, MD  12/11/2016, 11:18 AM

## 2016-12-12 ENCOUNTER — Telehealth: Payer: Self-pay

## 2016-12-12 ENCOUNTER — Encounter (HOSPITAL_COMMUNITY): Payer: Self-pay | Admitting: Cardiovascular Disease

## 2016-12-12 DIAGNOSIS — I5023 Acute on chronic systolic (congestive) heart failure: Secondary | ICD-10-CM

## 2016-12-12 LAB — BASIC METABOLIC PANEL
ANION GAP: 11 (ref 5–15)
BUN: 35 mg/dL — ABNORMAL HIGH (ref 6–20)
CALCIUM: 10.2 mg/dL (ref 8.9–10.3)
CO2: 30 mmol/L (ref 22–32)
Chloride: 97 mmol/L — ABNORMAL LOW (ref 101–111)
Creatinine, Ser: 2.28 mg/dL — ABNORMAL HIGH (ref 0.61–1.24)
GFR calc non Af Amer: 25 mL/min — ABNORMAL LOW (ref >60)
GFR, EST AFRICAN AMERICAN: 29 mL/min — AB (ref >60)
Glucose, Bld: 121 mg/dL — ABNORMAL HIGH (ref 65–99)
Potassium: 4.6 mmol/L (ref 3.5–5.1)
Sodium: 138 mmol/L (ref 135–145)

## 2016-12-12 LAB — CBC
HEMATOCRIT: 39.3 % (ref 39.0–52.0)
HEMOGLOBIN: 12.5 g/dL — AB (ref 13.0–17.0)
MCH: 26.8 pg (ref 26.0–34.0)
MCHC: 31.8 g/dL (ref 30.0–36.0)
MCV: 84.3 fL (ref 78.0–100.0)
Platelets: 146 10*3/uL — ABNORMAL LOW (ref 150–400)
RBC: 4.66 MIL/uL (ref 4.22–5.81)
RDW: 17.8 % — ABNORMAL HIGH (ref 11.5–15.5)
WBC: 6.3 10*3/uL (ref 4.0–10.5)

## 2016-12-12 LAB — KAPPA/LAMBDA LIGHT CHAINS
KAPPA FREE LGHT CHN: 64.4 mg/L — AB (ref 3.3–19.4)
KAPPA, LAMDA LIGHT CHAIN RATIO: 1.77 — AB (ref 0.26–1.65)
LAMDA FREE LIGHT CHAINS: 36.4 mg/L — AB (ref 5.7–26.3)

## 2016-12-12 LAB — MAGNESIUM: MAGNESIUM: 2.4 mg/dL (ref 1.7–2.4)

## 2016-12-12 MED ORDER — ISOSORBIDE MONONITRATE ER 30 MG PO TB24: 15.0000 mg | ORAL_TABLET | Freq: Every day | ORAL | 2 refills | Status: DC

## 2016-12-12 MED ORDER — FUROSEMIDE 40 MG PO TABS: 40.0000 mg | ORAL_TABLET | Freq: Two times a day (BID) | ORAL | Status: DC

## 2016-12-12 MED ORDER — GUAIFENESIN-DM 100-10 MG/5ML PO SYRP
5.0000 mL | ORAL_SOLUTION | ORAL | Status: DC | PRN
  Administered 2016-12-12 (×2): 5 mL via ORAL
  Filled 2016-12-12 (×2): qty 5

## 2016-12-12 MED ORDER — FUROSEMIDE 40 MG PO TABS: 40.0000 mg | ORAL_TABLET | ORAL | 1 refills | Status: AC

## 2016-12-12 MED ORDER — ISOSORBIDE MONONITRATE ER 30 MG PO TB24: 15.0000 mg | ORAL_TABLET | Freq: Every day | ORAL | Status: DC

## 2016-12-12 NOTE — Progress Notes (Signed)
OT Cancellation    12/12/16 1300  OT Visit Information  Last OT Received On 12/12/16  Assistance Needed +1  Reason Eval/Treat Not Completed Patient declined, no reason specified (Pt eating lunch. Request to come back later. Pt states "I aint in the mood for all that." Will check back as schedule allows.)   [facility] LLCCharis Reganne Messerschmidt, OTR/L [phone]

## 2016-12-12 NOTE — Progress Notes (Signed)
Pt has orders to be discharged. Discharge instructions given and pt has no additional questions at this time. Medication regimen reviewed and pt educated. Pt verbalized understanding and has no additional questions. Telemetry box removed. IV removed and site in good condition. Pt stable and waiting for transportation.  Ahijah Devery RN 

## 2016-12-12 NOTE — Care Management Important Message (Signed)
Important Message  Patient Details  Name: Darrell Glass MRN: [id number] Date of Birth: [birth year]   Medicare Important Message Given:  Yes    [name] 12/12/2016, 3:03 PM

## 2016-12-12 NOTE — Discharge Summary (Signed)
Name: Darrell Glass MRN: [id number] DOB: [birth year] 83 y.o. PCP: Patient, No Pcp Per  Date of Admission: 12/06/2016  1:12 PM Date of Discharge: 12/12/2016 Attending Physician: Axel Filler  Discharge Diagnosis: 1. Acute on chronic congestive heart failure exacerbation 2. Pancytopenia 3. COPD Principal Problem:   Acute exacerbation of congestive heart failure (HCC) Active Problems:   COPD (chronic obstructive pulmonary disease) (HCC)   CAD in native artery   Thrombocytopenia (HCC)   CKD (chronic kidney disease)   Acute on chronic systolic CHF (congestive heart failure) ([place])  Discharge Medications: Allergies as of 12/12/2016   No Known Allergies     Medication List    STOP taking these medications   carvedilol 12.5 MG tablet Commonly known as:  COREG   naproxen sodium 220 MG tablet Commonly known as:  ANAPROX   spironolactone 25 MG tablet Commonly known as:  ALDACTONE     TAKE these medications   albuterol 108 (90 Base) MCG/ACT inhaler Commonly known as:  PROVENTIL HFA;VENTOLIN HFA Inhale 1-2 puffs into the lungs every 6 (six) hours as needed for wheezing or shortness of breath.   atorvastatin 40 MG tablet Commonly known as:  LIPITOR Take 40 mg by mouth at bedtime.   BREO ELLIPTA 100-25 MCG/INH Aepb Generic drug:  fluticasone furoate-vilanterol Inhale 1 puff into the lungs every morning.   famotidine 20 MG tablet Commonly known as:  PEPCID Take 20 mg by mouth daily.   finasteride 5 MG tablet Commonly known as:  PROSCAR Take 5 mg by mouth every evening.   furosemide 40 MG tablet Commonly known as:  LASIX Take 1 tablet (40 mg total) by mouth every morning.   isosorbide mononitrate 30 MG 24 hr tablet Commonly known as:  IMDUR Take 0.5 tablets (15 mg total) by mouth daily.   tamsulosin 0.4 MG Caps capsule Commonly known as:  FLOMAX Take 0.4 mg by mouth daily after breakfast.       Disposition and follow-up:   [name] was discharged from  [facility] in Stable condition.  At the hospital follow up visit please address:  1.   CHF: --patient discharged on lasix '40mg'$  daily and Imdur '15mg'$  daily --stopped spironolactone and carvedilol --is he taking meds consistently? Using pill box? Salt/fluid restriction? --dry weight ~61kgs --consider starting back carvedilol at low dose --has cardiology follow up on 5/18 --has [facility] been arranged? --symptoms of fluid overload?  AKI:  --b/l at around 1.8; at discharge was 2.28 (peak 2.6 during admission); did not respond to diuresis, not obstructive --in setting of pancytopenia, doing multiple myeloma w/u: f/u immunofixation and light chains  2.  Labs / imaging needed at time of follow-up: Bmet for Cr and K (goal >4 due to severe ICM)  3.  Pending labs/ test needing follow-up: immunofixation assay and serum light chains  Follow-up Appointments: Follow-up Information    [facility] Follow up.   Specialty:  [facility] Why:  They will do your home health care at your home Contact information: [address] [place] [address] [phone]        [facility]. Go on 12/20/2016.   Why:  at 10:15 AM Contact information: [address] [facility]. Go on 12/23/2016.   Why:  at 1:30pm Contact information: [address] [place] [address] [phone]  Hospital Course by problem list: Principal Problem:   Acute exacerbation of congestive heart failure (HCC) Active Problems:   COPD (chronic obstructive pulmonary disease) (HCC)   CAD in native artery   Thrombocytopenia (HCC)   CKD (chronic kidney disease)   Acute on chronic systolic CHF (congestive heart failure) (HCC)   CHF, ischemic cardiomyopathy s/p CABG and ICD: Patient with h/o ICM s/p CABG, EF 20-25%, s/p ICD  presented on 12/06/16 with wheezing and shortness of breath, orthopnea, bendopnia,  and endorsed inconsistent use of various medications including his furosemide, spironolactone, and carvedilol. On presentation his weight was 71.9 kg, which was markedly elevated from 61kg (discharge weight in March 2018). On exam he had significant peripheral edema, wheezing and rales. On admission, CXR did not reveal consolidation or infiltrates; EKG was unchanged from priors and without ischemic changes; BNP was significantly elevated. He was diuresed successfully with improvement of his symptoms. On 5/4 he clinically appeared euvolemic but weights were still up; he underwent a RHC which did show elevated PCWP so diuresis was continued. At discharge he was back to dry weight of 61kg. He was discharged on Lasix '40mg'$  daily and Imdur '15mg'$  daily. He is to follow up with [facility] as well as cardiology within 2 weeks.   AKI on CKD: Patient presented with elevated creatinine compared to previous admission, which did not respond with diuresis as expected. Post-renal obstruction was also ruled out with a renal ultrasound. The cause of his intrarenal injury is uncertain but with pancytopenia discussed below there was concern for multiple myeloma; we have sent out immunofixation and serum light chain assays which were still pending at time of discharge. His Cr at discharge was 2.28 (previous known baseline was 1.88. At [facility] appointment, please consider rechecking Bmet for renal function and to check potassium level in setting of lasix use.   COPD, on home oxygen: Patient initially had wheezing on presentation but was not thought to be having an exacerbation. He did receive IV steroids in the ED and was provided duonebs as needed throughout hospital stay on top of his home Breo. He was continued on his home 2L oxygen qhs and PRN with activity.   Pancytopenia: Pt with leukopenia, anemia (normal MCV), thrombocytopenia and had a reticulocyte  count of 1.1% at last admission. With his age, he is at risk for myelodysplastic syndrome. Peripheral smear shows thrombocytopenia consistent with lab counts; burr cells; slight atypical nature to PMNs that is nonspecific - suggestive of MDS or BM infiltrative process and in setting of worsening renal failure there is concern for MM. We have sent for immunofixation and serum light chains assays, which were not resulted at time of discharge.   Discharge Vitals:   BP 114/67 (BP Location: Left Arm)   Pulse 93   Temp 98.8 F (37.1 C) (Oral)   Resp 18   Ht '5\' 8"'$  (1.727 m)   Wt 61.4 kg (135 lb 6.4 oz)   SpO2 100%   BMI 20.59 kg/m   Pertinent Labs, Studies, and Procedures:  BMP Latest Ref Rng & Units 12/12/2016 12/11/2016 12/10/2016  Glucose 65 - 99 mg/dL 121(H) 142(H) 111(H)  BUN 6 - 20 mg/dL 35(H) 34(H) 29(H)  Creatinine 0.61 - 1.24 mg/dL 2.28(H) 2.52(H) 2.24(H)  Sodium 135 - 145 mmol/L 138 137 137  Potassium 3.5 - 5.1 mmol/L 4.6 4.4 3.8  Chloride 101 - 111 mmol/L 97(L) 95(L) 95(L)  CO2 22 - 32 mmol/L 30 31 32  Calcium 8.9 - 10.3 mg/dL 10.2  9.8 9.5   CBC Latest Ref Rng & Units 12/12/2016 12/11/2016 12/10/2016  WBC 4.0 - 10.5 K/uL 6.3 6.5 5.3  Hemoglobin 13.0 - 17.0 g/dL 12.5(L) 11.2(L) 10.0(L)  Hematocrit 39.0 - 52.0 % 39.3 36.0(L) 32.1(L)  Platelets 150 - 400 K/uL 146(L) 129(L) 97(L)   CXR 12/06/16:  Stable cardiomegaly; no focal infiltrate  Renal U/S 12/11/16: No hydronephrosis Mildly echogenic kidneys indicating nonspecific renal parenchymal disease of uncertain chronicity Simple renal cysts bilaterally Normal bladder Prominently enlarged prostate  Discharge Instructions: Discharge Instructions    (HEART FAILURE PATIENTS) Call MD:  Anytime you have any of the following symptoms: 1) 3 pound weight gain in 24 hours or 5 pounds in 1 week 2) shortness of breath, with or without a dry hacking cough 3) swelling in the hands, feet or stomach 4) if you have to sleep on extra pillows at night in  order to breathe.    Complete by:  As directed    AMB Referral to [place] Management    Complete by:  As directed    Reason for consult:  COPD exac and HF   Diagnoses of:   Heart Failure COPD/ Pneumonia     Expected date of contact:  1-3 days (reserved for hospital discharges)   Please assign to community nurse for transition of care calls and assess for home visits. Questions please call:   Natividad Brood, RN BSN [facility] Liaison  [phone] business mobile phone Toll free office [phone]   Call MD for:  difficulty breathing, headache or visual disturbances    Complete by:  As directed    Call MD for:  extreme fatigue    Complete by:  As directed    Call MD for:  hives    Complete by:  As directed    Call MD for:  persistant dizziness or light-headedness    Complete by:  As directed    Call MD for:  persistant nausea and vomiting    Complete by:  As directed    Call MD for:  redness, tenderness, or signs of infection (pain, swelling, redness, odor or green/yellow discharge around incision site)    Complete by:  As directed    Call MD for:  severe uncontrolled pain    Complete by:  As directed    Call MD for:  temperature >100.4    Complete by:  As directed    Diet - low sodium heart healthy    Complete by:  As directed    Discharge instructions    Complete by:  As directed    Please follow only the medication list that is provided in this paperwork.  For heart failure: --take isosorbide (Imdur) half a tab once a day --take furosemide (lasix) one tab ('40mg'$ ) every morning  For COPD: --continue the Breo daily and albuterol when needed  For prostate: --continue finasteride '5mg'$  daily and tamsulosin 0.'4mg'$  daily  Mr. Uber had worsening kidney function that we are checking labs on. To help protect his kidneys, stop taking any ibuprofen, naproxen, Aleve, Advil/Motrin. If there is pain, you can use over the counter tylenol.  Please keep the  appointments listed on this documentation. There will likely be changes to his heart medicines in the coming weeks, so it would be helpful for Shonda to come to each appointment if able to.   Use a pill box to help make medicine taking easier and more consistent.  Limit fluid intake to less than 2 liters,  and sodium intake to less than 2 grams.   Weigh yourself daily; if there is an increase in weight by 2lbs in a day or 3-5lbs over a week, please call the Internal Medicine Clinic at number provided for help with management.  You will be contacted by [facility] and by [place] in the next couple of days   Increase activity slowly    Complete by:  As directed       Signed: Alphonzo Grieve, MD 12/12/2016, 6:41 PM   Pager [phone]

## 2016-12-12 NOTE — Care Management Note (Addendum)
Case Management Note  Patient Details  Name: Darrell Glass MRN: [id number] Date of Birth: 1932/11/28  Subjective/Objective:    Admitted with CHF               Action/Plan: Patient lives at home with daughter; [facility]; has private insurance with [employer]; HHC choice offered, [name] [name] with [facility] called for arrangements; DME - he has a cane and home oxygen; [place] called for possible home scales.  Expected Discharge Date: 12/12/2016              Expected Discharge Plan:  Home w Home Health Services  Discharge planning Services  CM Consult  Choice offered to:  Patient   HH Arranged:  RN, Disease Management, PT HH [facility]  [facility]  Status of Service:  In process, will continue to follow  Reola MosherChandler, Aricela Bertagnolli L, RN,MHA,BSN [phone] 12/12/2016, 12:13 PM

## 2016-12-12 NOTE — Progress Notes (Signed)
Transitions of Care Pharmacy Note  Plan:  Educated on new medication: isosorbide  Outpatient follow-up: blood pressure --------------------------------------------- Darrell Glass is an 83 y.o. male who presents with a chief complaint CHF exacerbation. In anticipation of discharge, pharmacy has reviewed this patient's prior to admission medication history, as well as current inpatient medications [facility].  Current medication indications, dosing, frequency, and notable side effects reviewed with patient and family. patient and family verbalized understanding of current inpatient medication regimen and is aware that the After Visit Summary when presented, will represent the most accurate medication list at discharge.   Darrell Glass did not express any concerns regarding changes to his medications. His daughter was present as we went through the changes, to include: addition of isosorbide and stopping coreg, spironolactone, and naproxen (per D/C sheet provided by RN). I encouraged the patient and his family to monitor the patients BP at home as needed.   The patient reported no further questions or comments at the conclusion of our visit.   Assessment: Understanding of regimen: fair Understanding of indications: fair Potential of compliance: fair  Patient instructed to contact inpatient pharmacy team with further questions or concerns if needed.    Time spent preparing for discharge counseling: 10 min  Time spent counseling patient: 15 min    York CeriseKatherine Cook, PharmD Pharmacy Resident  Pager [phone] 12/12/16 3:21 PM

## 2016-12-12 NOTE — Progress Notes (Signed)
Physical Therapy Treatment Patient Details Name: Darrell Glass MRN: [id number] DOB: June 22, 1933 Today's Date: 12/12/2016    History of Present Illness Patient is a 83 y/o male admitted on 12/06/16 with 1 week of dyspnea on exertion, thrombocytopenia and CHF exacerbation. hx of pacemaker, CAD, COPD, CHF, CABG, HLD and PRN o2.     PT Comments    Pt eager to d/c home. Pt however with noted balance impairment and deconditioning. Recommended HHPT to address balance deficits to minimize fall risk. Pt agreeable and reports daughter will be there 24/7.   Follow Up Recommendations  Home health PT;Supervision/Assistance - 24 hour (for strengthening and balance)     Equipment Recommendations  None recommended by PT    Recommendations for Other Services       Precautions / Restrictions Precautions Precautions: Fall Restrictions Weight Bearing Restrictions: No    Mobility  Bed Mobility Overal bed mobility: Modified Independent Bed Mobility: Supine to Sit     Supine to sit: Supervision     General bed mobility comments: didn't use bed rail, slight increase in time  Transfers Overall transfer level: Needs assistance Equipment used: Straight cane Transfers: Sit to/from Stand Sit to Stand: Min guard         General transfer comment: Min guard for safety from EOB  Ambulation/Gait Ambulation/Gait assistance: Min guard;Min assist Ambulation Distance (Feet): 100 Feet Assistive device: Straight cane Gait Pattern/deviations: Step-through pattern;Decreased step length - right;Decreased step length - left;Narrow base of support Gait velocity: dec Gait velocity interpretation: Below normal speed for age/gender General Gait Details: initial v/c's for appropriate sequencing with cane, pt min guard majority of time however requiring occasional minA to maintain balance during episodes of LOB. pt with lateral sway to both L and R.and demo's R lateral drifting   Stairs             Wheelchair Mobility    Modified Rankin (Stroke Patients Only)       Balance Overall balance assessment: Needs assistance Sitting-balance support: No upper extremity supported;Feet supported Sitting balance-Leahy Scale: Good     Standing balance support: Single extremity supported Standing balance-Leahy Scale: Fair                              Cognition Arousal/Alertness: Awake/alert Behavior During Therapy: WFL for tasks assessed/performed Overall Cognitive Status: Within Functional Limits for tasks assessed                                        Exercises      General Comments        Pertinent Vitals/Pain Pain Assessment: No/denies pain    Home Living                      Prior Function            PT Goals (current goals can now be found in the care plan section) Acute Rehab PT Goals Patient Stated Goal: home today Progress towards PT goals: Progressing toward goals    Frequency    Min 3X/week      PT Plan Discharge plan needs to be updated    Co-evaluation              AM-PAC PT "6 Clicks" Daily Activity  Outcome Measure  Difficulty turning over in bed (including adjusting bedclothes,  sheets and blankets)?: None Difficulty moving from lying on back to sitting on the side of the bed? : None Difficulty sitting down on and standing up from a chair with arms (e.g., wheelchair, bedside commode, etc,.)?: A Little Help needed moving to and from a bed to chair (including a wheelchair)?: A Little Help needed walking in hospital room?: A Little Help needed climbing 3-5 steps with a railing? : A Little 6 Click Score: 20    End of Session Equipment Utilized During Treatment: Gait belt Activity Tolerance: Patient tolerated treatment well Patient left: in chair;with call bell/phone within reach;with chair alarm set (MD present) Nurse Communication: Mobility status PT Visit Diagnosis: Muscle weakness  (generalized) (M62.81);Difficulty in walking, not elsewhere classified (R26.2)     Time: 8469-6295 PT Time Calculation (min) (ACUTE ONLY): 20 min  Charges:  $Gait Training: 8-22 mins                    G Codes:       Lewis Shock, PT, DPT Pager #: [phone] Office #: [phone]    Starla Deller M Armari Fussell 12/12/2016, 1:01 PM

## 2016-12-12 NOTE — Consult Note (Signed)
   [facility] CM In[facility] Consult   12/12/2016  Darrell Glass 02-22-1933 [id number]  Follow up:  Referral from inpatient and MD for post hospital follow up.  Patient states he is not sure about his needs for scale and follow up when he gets home.  Patient unsure of his primary care provider and has a new cardiologist to follow up with.  Will follow for post hospital needs.  Consent form signed.  Will have home health care as well.  Transition of care needs to be addressed by [facility] Care Management. For questions, please contact:  Charlesetta ShanksVictoria Micaylah Bertucci, RN BSN CCM [facility] Liaison  [phone] business mobile phone Toll free office [phone]

## 2016-12-12 NOTE — Progress Notes (Signed)
Patient Name: Darrell Glass Date of Encounter: 12/12/2016  Primary Cardiologist: NEW (Croitoru)  Hospital Problem List     Principal Problem:   Acute exacerbation of congestive heart failure (HCC) Active Problems:   COPD (chronic obstructive pulmonary disease) (HCC)   CAD in native artery   Thrombocytopenia (HCC)   CKD (chronic kidney disease)   Acute on chronic systolic CHF (congestive heart failure) (HCC)    Subjective   No complaints sitting in chair no chest pain or dyspnea .   Inpatient Medications    Scheduled Meds: . aspirin  81 mg Oral Daily  . atorvastatin  40 mg Oral QHS  . famotidine  20 mg Oral Daily  . finasteride  5 mg Oral QPM  . fluticasone furoate-vilanterol  1 puff Inhalation Daily  . furosemide  60 mg Oral Daily  . mouth rinse  15 mL Mouth Rinse BID  . sodium chloride flush  3 mL Intravenous Q12H  . tamsulosin  0.4 mg Oral QPC breakfast   Continuous Infusions: . sodium chloride     PRN Meds: sodium chloride, guaiFENesin-dextromethorphan, ipratropium-albuterol, ondansetron **OR** ondansetron (ZOFRAN) IV, sodium chloride flush   Vital Signs    Vitals:   12/11/16 1230 12/11/16 1956 12/12/16 0412 12/12/16 0737  BP: 126/71 109/63 118/61   Pulse: 88 93 97   Resp: 18 18 18    Temp: 98.5 F (36.9 C) 98.7 F (37.1 C) 99.1 F (37.3 C)   TempSrc: Oral Oral Oral   SpO2: 97% 97% 95% 96%  Weight:   135 lb 6.4 oz (61.4 kg)   Height:        Intake/Output Summary (Last 24 hours) at 12/12/16 1007 Last data filed at 12/12/16 0400  Gross per 24 hour  Intake              362 ml  Output             2050 ml  Net            -1688 ml   Filed Weights   12/10/16 0452 12/11/16 0541 12/12/16 0412  Weight: 145 lb 9.6 oz (66 kg) 137 lb 9.6 oz (62.4 kg) 135 lb 6.4 oz (61.4 kg)    Physical Exam    Affect appropriate Elderly male  HEENT: normal Neck supple with no adenopathy JVP normal no bruits no thyromegaly Lungs clear with no wheezing and good  diaphragmatic motion Heart:  S1/S2 AR / MR  murmur, no rub, gallop or click PMI increased post sternotomy  Abdomen: benighn, BS positve, no tenderness, no AAA no bruit.  No HSM or HJR Distal pulses intact with no bruits No edema Neuro non-focal Skin warm and dry No muscular weakness   Labs    CBC  Recent Labs  12/11/16 0326 12/12/16 0305  WBC 6.5 6.3  HGB 11.2* 12.5*  HCT 36.0* 39.3  MCV 85.1 84.3  PLT 129* 146*   Basic Metabolic Panel  Recent Labs  12/11/16 0326 12/12/16 0305  NA 137 138  K 4.4 4.6  CL 95* 97*  CO2 31 30  GLUCOSE 142* 121*  BUN 34* 35*  CREATININE 2.52* 2.28*  CALCIUM 9.8 10.2  MG 2.3 2.4   Liver Function Tests No results for input(s): AST, ALT, ALKPHOS, BILITOT, PROT, ALBUMIN in the last 72 hours.   Telemetry    NSR, PVCs - Personally Reviewed 12/12/2016   ECG    Sinus rhythm with 1st degree A-V block, LBBB - Personally Reviewed  Radiology    [place] Renal  Result Date: 12/11/2016 CLINICAL DATA:  Acute kidney injury.  Inpatient. EXAM: RENAL / URINARY TRACT ULTRASOUND COMPLETE COMPARISON:  None. FINDINGS: Right Kidney: Length: 8.8 cm. No right hydronephrosis. Mildly echogenic right renal parenchyma, which is normal in thickness. Simple 0.9 x 0.7 x 1.0 cm renal cyst in the lower right kidney. Left Kidney: Length: 10.7 cm. No left hydronephrosis. Mildly echogenic left renal parenchyma, which is normal in thickness. Simple 1.9 x 1.7 x 1.9 cm upper left renal cyst. Simple 3.3 x 2.1 x 3.0 cm interpolar left renal cyst. Simple exophytic 3.0 x 2.6 x 2.3 cm lower left renal cyst. Bladder: Appears normal for degree of bladder distention. Prominently enlarged prostate with dimensions 5.7 x 5.4 x 5.9 cm (volume = 95 cm^3). IMPRESSION: 1. No hydronephrosis . 2. Mildly echogenic kidneys indicating nonspecific renal parenchymal disease of uncertain chronicity. 3. Simple renal cysts bilaterally. 4. Normal bladder. 5. Prominently enlarged prostate. Electronically  Signed   By: Delbert PhenixJason A Poff M.D.   On: 12/11/2016 16:53    Cardiac Studies   2D ECHO: 10/06/2016 LV EF: 20% -   25% Study Conclusions - Left ventricle: The cavity size was moderately dilated. Systolic   function was severely reduced. The estimated ejection fraction   was in the range of 20% to 25%. Diffuse hypokinesis. There is   akinesis of the anteroseptal, anterior, inferoseptal, and apical   myocardium. Acoustic contrast opacification revealed no evidence   ofthrombus. - Aortic valve: Valve mobility was restricted. There was moderate   regurgitation. - Mitral valve: Severely thickened leaflets . There was moderate   regurgitation directed posteriorly. - Left atrium: The atrium was mildly dilated. - Right ventricle: The cavity size was moderately dilated. Wall   thickness was normal. Pacer wire or catheter noted in right   ventricle. - Tricuspid valve: There was mild regurgitation. - Pulmonary arteries: Systolic pressure was mildly increased. PA   peak pressure: 43 mm Hg (S).  Patient Profile     83 y.o. male recently moved from [place], with a h/o IHD/CAD with prior CABG, systolic HF with EF of 20-25%, s/p ICD, CKD, HTN, HLD, COPD, mitral regurgitation and Pulmonary HTNwho was admitted 12/06/16 for her second episode of acute on chronic systolic HF in last 3 months.  Assessment & Plan    1. Acute on Chronic Systolic CHF: Ischemic cardiomyopathy w/ chronic systolic HF. EF 20-25%. Right heart cath With reasonable pressures was on aldactone on admission but baseline Cr over 2 No AEC/ARB d/c aldactone D/c with lasix 40 bid add nitrates Further titration of meds as outpatient will arrange f/u Dr Royann Shiversroitoru and he can refer to CHF Clinic if needed   2. CAD:h/o CABG in [place]. No angina.  Continue medical therapy. We need to try to obtain records from [place]. Should be on ASA. He is on statin therapy with Lipitor 40. Target LDL<70.  3. ICD:we have made arrangements for remote f/u in our device  clinic, also f/u in [facility]. He has never received ICD shocks.   4. Acute on Chronic Kidney Disease: baseline SCr around 2 d/c aldactone   6. COPD:he is on home O2 and has 70 year smoking history, minimum 1 ppd, quit 2 years ago. On LABA.  7. Mitral Regurgitation:moderate by recent echo. Dr. Royann Shiversroitoru reviewed echo and thinks it is moderate to severe; looks like ischemic MR due to lateral wall motion abnormality and posterior leaflet tethering. Not a candidate for  redo surgery      Ok to d/c home today  Charlton Haws

## 2016-12-12 NOTE — Telephone Encounter (Signed)
Hospital TOC per dr Samuella Cotasvalina, appt 12/20/2016 @10 :15, discharge 12/12/2016.

## 2016-12-12 NOTE — Progress Notes (Signed)
   Subjective:  Patient feels well today and has been walking with assistance. He denies shortness of breath or chest tightness at rest or with exertion.   Objective:  Vital signs in last 24 hours: Vitals:   12/11/16 1230 12/11/16 1956 12/12/16 0412 12/12/16 0737  BP: 126/71 109/63 118/61   Pulse: 88 93 97   Resp: 18 18 18    Temp: 98.5 F (36.9 C) 98.7 F (37.1 C) 99.1 F (37.3 C)   TempSrc: Oral Oral Oral   SpO2: 97% 97% 95% 96%  Weight:   61.4 kg (135 lb 6.4 oz)   Height:       Constitutional: NAD CV: RRR, holosystolic murmur best heard at apex, pulses intact, minimal pitting edema BLE, warm extremities Resp: CTAB, no increased work of breathing Abd: soft, NDNT, +BS  Assessment/Plan:  Principal Problem:   Acute exacerbation of congestive heart failure (HCC) Active Problems:   COPD (chronic obstructive pulmonary disease) (HCC)   CAD in native artery   Thrombocytopenia (HCC)   CKD (chronic kidney disease)   Acute on chronic systolic CHF (congestive heart failure) (HCC)  Acute on chronic sCHF:  Weight 61.4 today; dry weight at 61kg. On exam he appears euvolemic. Patient is stable for discharge today and cardiology is in agreement. We discussed importance of taking his medications regularly, weigh himself, have regular follow up. --lasix 40mg  PO daily --restart low dose carvedilol; holding off on nitrates/hydral due to low/normal BP - will readdress outpatient --fluid restricted diet, sodium restricted diet --will follow up with in 1 week for Bmet and HFU  ICM, s/p CABG, s/p ICD HLD HTN: --atorvastatin 40mg  daily, asa 81mg  daily --restart low dose carvedilol; holding off on nitrates/hydral due to low/normal BP - will readdress outpatient --will follow up with cards, EP and CHF clinics --Home O2 - 2L qhs and PRN with activity  AKI:  Cr still elevated today from baseline despite adequate diuresis; he certainly doesn't appear too dry and renal u/s did not show  obstruction (he has also had no problems with urine output). In setting of pancytopenia and abnormal peripheral smear there is concern for MM. --hold home lisinopril --PO Lasix 40mg  daily --repeat Bmet in one week --MM workup as below  COPD, on home oxygen: Stable. --Breo  --duoneb q6hr, PRN --Home O2 - 2L qhs and PRN with activity  Pancytopenia:  Pt with leukopenia, anemia (normal MCV), thrombocytopenia and had a reticulocyte count of 1.1% at last admission. With his age, he is at risk for myelodysplastic syndrome. Peripheral smear shows thrombocytopenia consistent with lab counts; burr cells; slight atypical nature to PMNs that is nonspecific - suggestive of MDS or BM infiltrative process and in setting of worsening renal failure there is concern for MM.  --f/u immunofixation assay, serum light chains  Diet: HH, fluid restricted DVT ppx: SCDs Code: DNR  Dispo: Anticipated discharge possibly today with HH PT.   Darrell Glass, Darrell Reth, MD 12/12/2016, 8:47 AM Pager [phone]

## 2016-12-12 NOTE — Progress Notes (Signed)
Subjective: Darrell Glass was sitting up in bed and eating breakfast when we spoke this morning. He said that he is feeling well and was able to ambulate in the hallway with the assistance of the nursing staff yesterday. He also mentioned that he is sleeping with the head of the bed closer to flat than he has been able to in a while. He denied shortness of breath while he is sitting up or after a brief walk in the hallway.   Objective: Vital signs in last 24 hours: Vitals:   12/11/16 1230 12/11/16 1956 12/12/16 0412 12/12/16 0737  BP: 126/71 109/63 118/61   Pulse: 88 93 97   Resp: 18 18 18    Temp: 98.5 F (36.9 C) 98.7 F (37.1 C) 99.1 F (37.3 C)   TempSrc: Oral Oral Oral   SpO2: 97% 97% 95% 96%  Weight:   61.4 kg (135 lb 6.4 oz)   Height:       Weight change: -0.998 kg (-2 lb 3.2 oz)  Intake/Output Summary (Last 24 hours) at 12/12/16 1113 Last data filed at 12/12/16 0400  Gross per 24 hour  Intake              362 ml  Output             1250 ml  Net             -888 ml   Physical Exam:  General: Well appearing, NAD  Pulm: CAB  CV: mildly tachycardic, holosystolic murmer best appreciated at apex   Assessment/Plan: Principal Problem:   Acute exacerbation of congestive heart failure (HCC) Active Problems:   COPD (chronic obstructive pulmonary disease) (HCC)   CAD in native artery   Thrombocytopenia (HCC)   CKD (chronic kidney disease)   Acute on chronic systolic CHF (congestive heart failure) (HCC)  Acute on Chronic CHF: Darrell Glass's shortness of breath, which was the main presenting symptom with this CHF exacerbation, has been markedly improved since he has been more euvolemic. He is now back to 61.4 kg, which is the same as his last reported dry weight. Now the priority for Darrell Glass is to transition to a stable regimen that he can maintain as an outpatient and to ensure that he receives the proper follow up attention to avoid readmission. This is complicated by [place] recent  [place] and his current lack of a consistent health care provider.  --Lasix 40mg  as home dose to prevent fluid accumulation  --begin hydralizine 25 mg 3 times daily and Isosorbide dinitrate 20 mg 3 times per day as blood pressures allow -- ensure follow up with advanced HF for monitoring   AKI: Darrell Glass has had an elevated Creatinine above 2 compared to his last admission, at which time it was 1.68. Post renal causes were unlikely with Darrell Glass's urine output and tolerance of diuresis and no hydronephrosis or bladder abnormalities were appreciated on renal U/S. His creatinine remains elevated now even as he is clinically euvolemic. This, along with his  BUN/Cr ratio, which has consistently been <20, suggests that the AKI might not be related to pre-renal causes. It is therefore important to investigate for intrarenal etiologies of AKI. We have sent off Kappa and Lambda Light chains and Immunofixation tests to evaluate for the presence of myeloma as a unifying diagnose.  --follow up with labs once discharged. --monitor Bmet while in hospital  --hold home lisinopril     Pancytopenia: Pancytopenia concerning for process affecting  bone marrow. We have sent kappa and lambda light chains as well as immunofixation electrophoresis to rule out myeloma and are waiting on the laboratory results.  COPD:  Darrell Glass was sitting up today and appeared comfortable on room air.  --up and out of bed  --duoneb as needed   This is a Psychologist, occupational Note.  The care of the patient was discussed with Dr. Samuella Cota and the assessment and plan formulated with their assistance.  Please see their attached note for official documentation of the daily encounter.   LOS: 5 days   Myra Rude, Medical Student 12/12/2016, 11:13 AM

## 2016-12-13 ENCOUNTER — Other Ambulatory Visit: Payer: Self-pay

## 2016-12-14 ENCOUNTER — Other Ambulatory Visit: Payer: Self-pay

## 2016-12-14 ENCOUNTER — Telehealth: Payer: Self-pay

## 2016-12-14 LAB — IMMUNOFIXATION ELECTROPHORESIS
IGA: 519 mg/dL — AB (ref 61–437)
IGM, SERUM: 36 mg/dL (ref 15–143)
IgG (Immunoglobin G), Serum: 1322 mg/dL (ref 700–1600)
TOTAL PROTEIN ELP: 7.1 g/dL (ref 6.0–8.5)

## 2016-12-14 NOTE — Telephone Encounter (Signed)
Matthias HughsKecia from [facility] needs to speak with a nurse about pt. Please call back.

## 2016-12-14 NOTE — Patient Outreach (Signed)
    Transition of care call completed. Home visit scheduled for later this month for further assessment of community care coordination, establishment of case management goals.

## 2016-12-15 ENCOUNTER — Telehealth: Payer: Self-pay | Admitting: Cardiovascular Disease

## 2016-12-15 DIAGNOSIS — D61818 Other pancytopenia: Secondary | ICD-10-CM | POA: Diagnosis not present

## 2016-12-15 DIAGNOSIS — I2729 Other secondary pulmonary hypertension: Secondary | ICD-10-CM | POA: Diagnosis not present

## 2016-12-15 DIAGNOSIS — E785 Hyperlipidemia, unspecified: Secondary | ICD-10-CM | POA: Diagnosis not present

## 2016-12-15 DIAGNOSIS — J449 Chronic obstructive pulmonary disease, unspecified: Secondary | ICD-10-CM | POA: Diagnosis not present

## 2016-12-15 DIAGNOSIS — N189 Chronic kidney disease, unspecified: Secondary | ICD-10-CM | POA: Diagnosis not present

## 2016-12-15 DIAGNOSIS — Z951 Presence of aortocoronary bypass graft: Secondary | ICD-10-CM | POA: Diagnosis not present

## 2016-12-15 DIAGNOSIS — Z95810 Presence of automatic (implantable) cardiac defibrillator: Secondary | ICD-10-CM | POA: Diagnosis not present

## 2016-12-15 DIAGNOSIS — D696 Thrombocytopenia, unspecified: Secondary | ICD-10-CM | POA: Diagnosis not present

## 2016-12-15 DIAGNOSIS — Z87891 Personal history of nicotine dependence: Secondary | ICD-10-CM | POA: Diagnosis not present

## 2016-12-15 DIAGNOSIS — I5023 Acute on chronic systolic (congestive) heart failure: Secondary | ICD-10-CM | POA: Diagnosis not present

## 2016-12-15 DIAGNOSIS — I2510 Atherosclerotic heart disease of native coronary artery without angina pectoris: Secondary | ICD-10-CM | POA: Diagnosis not present

## 2016-12-15 DIAGNOSIS — I129 Hypertensive chronic kidney disease with stage 1 through stage 4 chronic kidney disease, or unspecified chronic kidney disease: Secondary | ICD-10-CM | POA: Diagnosis not present

## 2016-12-15 DIAGNOSIS — I051 Rheumatic mitral insufficiency: Secondary | ICD-10-CM | POA: Diagnosis not present

## 2016-12-15 NOTE — Telephone Encounter (Signed)
Thank you!  Darrell Glass 

## 2016-12-15 NOTE — Telephone Encounter (Signed)
FYI: PT  rescheduled to evaluate patient today 12/15/16 due to a delay from yesterday.   (per Dellia CloudKesha- [facility]).

## 2016-12-15 NOTE — Telephone Encounter (Signed)
New Message:   Darrell KaufmannMelissa would like to know if Dr Eden EmmsNishan would do orders for Skilled Nursing for Cardio Pulmonary Program? Pt have been hospitalized 3 times in the last 3 months for COPD and Congested Heart Failure..Marland Kitchen

## 2016-12-15 NOTE — Telephone Encounter (Signed)
This is Dr Croitoru's patient

## 2016-12-16 NOTE — Telephone Encounter (Signed)
Yes he can be referred to the cardiopulmonary program. Also please make sure she did receive a referral to outpatient heart failure clinic.

## 2016-12-20 ENCOUNTER — Ambulatory Visit (INDEPENDENT_AMBULATORY_CARE_PROVIDER_SITE_OTHER): Payer: PPO | Admitting: Internal Medicine

## 2016-12-20 VITALS — BP 94/50 | HR 91 | Temp 97.6°F | Ht 68.0 in | Wt 137.3 lb

## 2016-12-20 DIAGNOSIS — N183 Chronic kidney disease, stage 3 unspecified: Secondary | ICD-10-CM

## 2016-12-20 DIAGNOSIS — J449 Chronic obstructive pulmonary disease, unspecified: Secondary | ICD-10-CM | POA: Diagnosis not present

## 2016-12-20 DIAGNOSIS — I5023 Acute on chronic systolic (congestive) heart failure: Secondary | ICD-10-CM | POA: Diagnosis not present

## 2016-12-20 DIAGNOSIS — N189 Chronic kidney disease, unspecified: Secondary | ICD-10-CM

## 2016-12-20 DIAGNOSIS — Z95810 Presence of automatic (implantable) cardiac defibrillator: Secondary | ICD-10-CM | POA: Diagnosis not present

## 2016-12-20 DIAGNOSIS — Z87891 Personal history of nicotine dependence: Secondary | ICD-10-CM | POA: Diagnosis not present

## 2016-12-20 NOTE — Assessment & Plan Note (Addendum)
Had AKI b/l crt 1.8, peaked 2.6 and was 2.28 on discharge, IFE and light chain ratio did not show any monoclonal pattern. Kappa to lambda light chain ratio 1.77    Will recheck BMET today.   Addendum: Crt bumped up again to 2.9. Unclear why. His BP is low, could be related to hypotension and ATN? Will decrease BP regimen. Will also refer to nephrology.

## 2016-12-20 NOTE — Assessment & Plan Note (Addendum)
Patient was admitted recently with acute CHF exacerbation with over 10 kg weight gain more than his baseline dry weight 61kg. Discharged on lasix 40mg  daily and imdur 15mg  daily.   Did not put on ACE or ARB due to CKD. Stopped spironolactone in the hospital. Had plan to start on bblocker when more stable, cards mentioned bisoprolol due to his COPD. May also benefit from adding hydralazine to his Imdur.   Weight today 62.3 kg. Doing well, no complaints other than occasional lightheadedness. euvolemic on exam.  -cont lasix 40mg  daily and imdur 15mg  daily. No room to add bblocker or any other meds for now. - check BMET - spent time talking about fluid restriction.   Addendum: bmet showed worsening Crt, unclear etiology. I wonder if his low BP is playing a role. Will have him stop imdur for now. Will also refer to nephrology.

## 2016-12-20 NOTE — Patient Instructions (Signed)
Keep your appointment with the cardiologist on 12/23/16   Continue your current medications.  Keep your total fluid intake to less than 1.5 Liter  Avoid salt  We will check your lab today.

## 2016-12-20 NOTE — Progress Notes (Signed)
   CC: hospital f/up for CHF  HPI:  [name] is a 83 y.o. with PMH as listed below who is here for HFU for CHF  Past Medical History:  Diagnosis Date  . AICD (automatic cardioverter/defibrillator) present   . Arthritis    "small of my back" (10/06/2016)  . CHF (congestive heart failure) (HCC)   . Chronic kidney disease (CKD), stage III (moderate)    stage III or IV/notes 10/06/2016  . COPD (chronic obstructive pulmonary disease) (HCC)   . Coronary artery disease   . High cholesterol   . Hypertension   . Iron deficiency anemia    "takes iron pill" (10/06/2016)  . Myocardial infarction [facility](HCC)    "led to triple bypass & defibrillator"   . On home oxygen therapy    "just relocated this weekend from [place]; he was on O2 there; don't have any here" (10/06/2016)  . Pneumonia    "@ least twice" (10/06/2016)   Admitted on 5/1 to 5/7 for Acute CHF exacerbation. Las EF 20-25%, has ICD, Weight was 10 kg above dry weight of 61kg. Was diuresed with IV lasix, RHC showed elevated PCWP indicating need for further diruesis.  Discharged on lasix 40mg  daily + imdur 15mg  daily, stopped spironolactone and coreg. Dry weight 61kg. Plan was to start back coreg at low dose at follow up. Has cards f/up on 5/18.   Weight today 62.3 kg. Doing well, no complaints other than occasional lightheadedness.  Had AKI b/l crt 1.8, peaked 2.6 and was 2.28 on discharge, IFE and light chain ratio did not show any monoclonal pattern. Kappa to lambda light chain ratio 1.77      Review of Systems:    Review of Systems  Constitutional: Negative for chills and fever.  Respiratory: Negative for cough and shortness of breath.   Cardiovascular: Negative for chest pain, palpitations and leg swelling.  Gastrointestinal: Negative for abdominal pain, heartburn, nausea and vomiting.  Genitourinary: Negative for dysuria.     Physical Exam:  Vitals:   12/20/16 1103  BP: (!) 94/50  Pulse: 91  Temp: 97.6 F (36.4 C)  TempSrc:  Oral  SpO2: 100%  Weight: 137 lb 4.8 oz (62.3 kg)  Height: 5\' 8"  (1.727 m)   Physical Exam  Constitutional: He is oriented to person, place, and time. He appears well-developed and well-nourished. No distress.  HENT:  Head: Normocephalic and atraumatic.  Cardiovascular: Normal rate and regular rhythm.  Exam reveals no gallop and no friction rub.   No murmur heard. Respiratory: Effort normal and breath sounds normal.  GI: Soft. Bowel sounds are normal.  Musculoskeletal: Normal range of motion. He exhibits no edema.  Neurological: He is alert and oriented to person, place, and time.  Skin: He is not diaphoretic.     Assessment & Plan:   See Encounters Tab for problem based charting.  Patient discussed with Dr. Criselda PeachesMullen

## 2016-12-21 ENCOUNTER — Other Ambulatory Visit: Payer: Self-pay

## 2016-12-21 DIAGNOSIS — I2729 Other secondary pulmonary hypertension: Secondary | ICD-10-CM | POA: Diagnosis not present

## 2016-12-21 DIAGNOSIS — I051 Rheumatic mitral insufficiency: Secondary | ICD-10-CM | POA: Diagnosis not present

## 2016-12-21 DIAGNOSIS — Z95810 Presence of automatic (implantable) cardiac defibrillator: Secondary | ICD-10-CM | POA: Diagnosis not present

## 2016-12-21 DIAGNOSIS — Z87891 Personal history of nicotine dependence: Secondary | ICD-10-CM | POA: Diagnosis not present

## 2016-12-21 DIAGNOSIS — I129 Hypertensive chronic kidney disease with stage 1 through stage 4 chronic kidney disease, or unspecified chronic kidney disease: Secondary | ICD-10-CM | POA: Diagnosis not present

## 2016-12-21 DIAGNOSIS — Z951 Presence of aortocoronary bypass graft: Secondary | ICD-10-CM | POA: Diagnosis not present

## 2016-12-21 DIAGNOSIS — D696 Thrombocytopenia, unspecified: Secondary | ICD-10-CM | POA: Diagnosis not present

## 2016-12-21 DIAGNOSIS — N189 Chronic kidney disease, unspecified: Secondary | ICD-10-CM | POA: Diagnosis not present

## 2016-12-21 DIAGNOSIS — I2510 Atherosclerotic heart disease of native coronary artery without angina pectoris: Secondary | ICD-10-CM | POA: Diagnosis not present

## 2016-12-21 DIAGNOSIS — J449 Chronic obstructive pulmonary disease, unspecified: Secondary | ICD-10-CM | POA: Diagnosis not present

## 2016-12-21 DIAGNOSIS — I5023 Acute on chronic systolic (congestive) heart failure: Secondary | ICD-10-CM | POA: Diagnosis not present

## 2016-12-21 DIAGNOSIS — E785 Hyperlipidemia, unspecified: Secondary | ICD-10-CM | POA: Diagnosis not present

## 2016-12-21 DIAGNOSIS — D61818 Other pancytopenia: Secondary | ICD-10-CM | POA: Diagnosis not present

## 2016-12-21 LAB — BASIC METABOLIC PANEL
BUN / CREAT RATIO: 15 (ref 10–24)
BUN: 46 mg/dL — ABNORMAL HIGH (ref 8–27)
CO2: 23 mmol/L (ref 18–29)
CREATININE: 2.98 mg/dL — AB (ref 0.76–1.27)
Calcium: 9.7 mg/dL (ref 8.6–10.2)
Chloride: 97 mmol/L (ref 96–106)
GFR, EST AFRICAN AMERICAN: 22 mL/min/{1.73_m2} — AB (ref >59)
GFR, EST NON AFRICAN AMERICAN: 19 mL/min/{1.73_m2} — AB (ref >59)
Glucose: 120 mg/dL — ABNORMAL HIGH (ref 65–99)
Potassium: 4.2 mmol/L (ref 3.5–5.2)
SODIUM: 139 mmol/L (ref 134–144)

## 2016-12-21 NOTE — Addendum Note (Signed)
Addended by: Hyacinth MeekerAHMED, Jodean Valade on: 12/21/2016 08:40 AM   Modules accepted: Orders

## 2016-12-21 NOTE — Progress Notes (Signed)
Internal Medicine Clinic Attending  Case discussed with Dr. Ahmed soon after the resident saw the patient.  We reviewed the resident's history and exam and pertinent patient test results.  I agree with the assessment, diagnosis, and plan of care documented in the resident's note. 

## 2016-12-22 NOTE — Patient Outreach (Signed)
[facility]) Care Management   12/22/2016  Darrell Glass Sep 17, 1932 [id number]  Darrell Glass is an 83 y.o. male  Subjective:  I don't know about this heart failure stuff. I have been having problems with wetting my clothes  Objective:   ROS Frail, petite elderly gentleman  Physical Exam ROS  Encounter Medications:   Outpatient Encounter Prescriptions as of 12/21/2016  Medication Sig  . albuterol (PROVENTIL HFA;VENTOLIN HFA) 108 (90 Base) MCG/ACT inhaler Inhale 1-2 puffs into the lungs every 6 (six) hours as needed for wheezing or shortness of breath.  Marland Kitchen. atorvastatin (LIPITOR) 40 MG tablet Take 40 mg by mouth at bedtime.  . famotidine (PEPCID) 20 MG tablet Take 20 mg by mouth daily.  . finasteride (PROSCAR) 5 MG tablet Take 5 mg by mouth every evening.  . fluticasone furoate-vilanterol (BREO ELLIPTA) 100-25 MCG/INH AEPB Inhale 1 puff into the lungs every morning.  . furosemide (LASIX) 40 MG tablet Take 1 tablet (40 mg total) by mouth every morning.  . tamsulosin (FLOMAX) 0.4 MG CAPS capsule Take 0.4 mg by mouth daily after breakfast.   No facility-administered encounter medications on file as of 12/21/2016.     Functional Status:   In your present state of health, do you have any difficulty performing the following activities: 12/20/2016 12/06/2016  Hearing? N N  Vision? Y N  Difficulty concentrating or making decisions? Y N  Walking or climbing stairs? Y N  Dressing or bathing? N N  Doing errands, shopping? Y Y    Fall/Depression Screening:    Fall Risk  12/20/2016  Falls in the past year? No   PHQ 2/9 Scores 12/20/2016  PHQ - 2 Score 0   [facility] CM Care Plan Problem One     Most Recent Value  Care Plan Problem One  patient has recent diagnosis of heart failure with a heart failure diagnosis  Role Documenting the Problem One  Care Management Coordinator  Care Plan for Problem One  Active  [facility] Long Term Goal (31-90 days)  patient will have no more than on acute  care visi for heart failure in the next 31 days.    [facility] Long Term Goal Start Date  12/21/16  Interventions for Problem One Long Term Goal  Initial home visit to assess community care coordination and chronic disease education needs  [facility] CM Short Term Goal #1 (0-30 days)  [facility] failure [facility]  [facility] CM Short Term Goal #1 Start Date  12/21/16  Interventions for Short Term Goal #1  During this iniital home visit, patient reports he does not know anything much about heart failure     [facility] CM Care Plan Problem Two     Most Recent Value  Care Plan Problem Two  patient has incontience  Role Documenting the Problem Two  Care Management Coordinator  Care Plan for Problem Two  Active  [facility] CM Short Term Goal #1 (0-30 days)  In the next 28 days, patient will have access to necessary resources/supplies to manage his urinary incontience  [facility] CM Short Term Goal #1 Start Date  12/21/16  Interventions for Short Term Goal #2   During this home viisit, patient reports he has urinary incontience     Fall Risk  12/20/2016  Falls in the past year? No    Assessment:   Patient has urinary incontinence, fax sent to patient's primary care physician to advise him and to request order for incontinent supplies  to assist with management of the urinary incontinent.  Family chose [facility] as provider for incontinent supplies. Patient will be provided with a scale from the [facility] Heart Failure Management Program. Patient and his daughter educated on the heart failure action plan, need for  Daily weights and to record.   Patient states understanding the need to call his primary care physician/cardiologist to report RNCM advised this patient and daughter to report 3 poundweight gain in 24 hoursor 5 pounds in a week. .Patient education on fall prevention, his current blood pressure is100/60.  Patient states that is his normal.  Plan:   Telephone contact in the next 21 days for  follow up.

## 2016-12-23 ENCOUNTER — Ambulatory Visit: Payer: PPO | Admitting: Physician Assistant

## 2016-12-26 DIAGNOSIS — D61818 Other pancytopenia: Secondary | ICD-10-CM | POA: Diagnosis not present

## 2016-12-26 DIAGNOSIS — Z951 Presence of aortocoronary bypass graft: Secondary | ICD-10-CM | POA: Diagnosis not present

## 2016-12-26 DIAGNOSIS — I5023 Acute on chronic systolic (congestive) heart failure: Secondary | ICD-10-CM | POA: Diagnosis not present

## 2016-12-26 DIAGNOSIS — J449 Chronic obstructive pulmonary disease, unspecified: Secondary | ICD-10-CM | POA: Diagnosis not present

## 2016-12-26 DIAGNOSIS — I2729 Other secondary pulmonary hypertension: Secondary | ICD-10-CM | POA: Diagnosis not present

## 2016-12-26 DIAGNOSIS — Z87891 Personal history of nicotine dependence: Secondary | ICD-10-CM | POA: Diagnosis not present

## 2016-12-26 DIAGNOSIS — Z95810 Presence of automatic (implantable) cardiac defibrillator: Secondary | ICD-10-CM | POA: Diagnosis not present

## 2016-12-26 DIAGNOSIS — I129 Hypertensive chronic kidney disease with stage 1 through stage 4 chronic kidney disease, or unspecified chronic kidney disease: Secondary | ICD-10-CM | POA: Diagnosis not present

## 2016-12-26 DIAGNOSIS — N189 Chronic kidney disease, unspecified: Secondary | ICD-10-CM | POA: Diagnosis not present

## 2016-12-26 DIAGNOSIS — D696 Thrombocytopenia, unspecified: Secondary | ICD-10-CM | POA: Diagnosis not present

## 2016-12-26 DIAGNOSIS — E785 Hyperlipidemia, unspecified: Secondary | ICD-10-CM | POA: Diagnosis not present

## 2016-12-26 DIAGNOSIS — I051 Rheumatic mitral insufficiency: Secondary | ICD-10-CM | POA: Diagnosis not present

## 2016-12-26 DIAGNOSIS — I2510 Atherosclerotic heart disease of native coronary artery without angina pectoris: Secondary | ICD-10-CM | POA: Diagnosis not present

## 2016-12-29 ENCOUNTER — Other Ambulatory Visit: Payer: Self-pay

## 2016-12-29 NOTE — Progress Notes (Signed)
Cardiology Office Note    Date:  12/31/2016   ID:  Darrell Glass, DOB 1932/09/28, MRN [id number]  PCP:  Fleet Contras, MD  Cardiologist: Dr. Royann Shivers   Chief Complaint  Patient presents with  . Follow-up    post hospital visit    History of Present Illness:    Darrell Glass is a 83 y.o. male with past medical history of CAD (s/p CABG, performed in [place]), ischemic cardiomyopathy (EF 20-25% in 10/2016, s/p ICD placement), HTN, HLD, COPD and Stage 3 CKD who presents to the office today for hospital follow-up.   He was recently admitted to [facility] from 5/1 - 12/12/2016 for acute on chronic diastolic CHF. A RHC was performed on 5/4 and showed elevated PCWP in setting of known acute on chronic systolic CHF and mitral regurgitation. He was diuresed with IV Lasix and switched to PO Lasix 40mg  BID at the time of hospital discharge (weight at 135 lbs). He was not placed on an ACE-I/ARB during admission and his PTA Spironolactone was discontinued in the setting of his baseline creatinine being > 2.0. Imdur 15mg  daily was added to his medication regimen. Unclear why Coreg was discontinued (possibly secondary to his COPD and hypotension).   He was examined by his PCP following discharge and creatinine was found to be elevated to 2.98. In the setting of hypotension, his Imdur was discontinued as it was thought his worsening kidney function was possibly secondary to hypoperfusion. Lasix was also reduced to 40mg  daily.   In talking with the patient today, he reports overall doing well since his recent hospitalization. He is not overly active at baseline and uses an electric wheelchair when traveling for some distance. He does walk around his house with a cane. Reports home health PT and nursing are visiting with him several times per week and he feels that this is helping with his improved strength.  He denies any recent chest discomfort, palpitations, dyspnea, or presyncope. Does have occasional dizziness when  walking around his house. He denies any recent orthopnea, PND, or lower extremity edema. He does not weigh regularly at home due to not having access to scales. Has poor PO fluid intake, as he only consumes 2 cans of soda daily. Refuses to drink water.    Past Medical History:  Diagnosis Date  . AICD (automatic cardioverter/defibrillator) present   . Arthritis    "small of my back" (10/06/2016)  . CHF (congestive heart failure) (HCC)    a. EF 20-25% by echo in 10/2016, ICD in place.   . Chronic kidney disease (CKD), stage III (moderate)    stage III or IV/notes 10/06/2016  . COPD (chronic obstructive pulmonary disease) (HCC)   . Coronary artery disease    a. s/p CABG --> performed in [place]  . High cholesterol   . Hypertension   . Iron deficiency anemia    "takes iron pill" (10/06/2016)  . Myocardial infarction [facility])    "led to triple bypass & defibrillator"   . On home oxygen therapy    "just relocated this weekend from [place]; he was on O2 there; don't have any here" (10/06/2016)  . Pneumonia    "@ least twice" (10/06/2016)    Past Surgical History:  Procedure Laterality Date  . CARDIAC CATHETERIZATION    . CORONARY ARTERY BYPASS GRAFT     CABG X3  . INSERT / REPLACE / REMOVE PACEMAKER    . RIGHT HEART CATH N/A 12/09/2016   Procedure: Right Heart Cath;  Surgeon: Kathleene HazelMcAlhany, Christopher D, MD;  [facility];  Service: Cardiovascular;  Laterality: N/A;    Current Medications: Outpatient Medications Prior to Visit  Medication Sig Dispense Refill  . albuterol (PROVENTIL HFA;VENTOLIN HFA) 108 (90 Base) MCG/ACT inhaler Inhale 1-2 puffs into the lungs every 6 (six) hours as needed for wheezing or shortness of breath.    Marland Kitchen. atorvastatin (LIPITOR) 40 MG tablet Take 40 mg by mouth at bedtime.    . famotidine (PEPCID) 20 MG tablet Take 20 mg by mouth daily.    . finasteride (PROSCAR) 5 MG tablet Take 5 mg by mouth every evening.    . fluticasone furoate-vilanterol (BREO ELLIPTA) 100-25  MCG/INH AEPB Inhale 1 puff into the lungs every morning.    . furosemide (LASIX) 40 MG tablet Take 1 tablet (40 mg total) by mouth every morning. 90 tablet 1  . tamsulosin (FLOMAX) 0.4 MG CAPS capsule Take 0.4 mg by mouth daily after breakfast.     No facility-administered medications prior to visit.      Allergies:   Patient has no known allergies.   Social History   Social History  . Marital status: Divorced    Spouse name: N/A  . Number of children: N/A  . Years of education: N/A   Social History Main Topics  . Smoking status: Former Smoker    Packs/day: 2.00    Years: 71.00    Types: Cigarettes  . Smokeless tobacco: Never Used     Comment: 10/06/2016 "stopped ~ 09/2015"  . Alcohol use No     Comment: 10/06/2016 "nothing for years and years"  . Drug use: No  . Sexual activity: Not Asked   Other Topics Concern  . None   Social History Narrative  . None     Family History:  The patient's family history includes Diabetes in his mother.   Review of Systems:   Please see the history of present illness.     General:  No chills, fever, night sweats or weight changes.  Cardiovascular:  No chest pain, dyspnea on exertion, edema, orthopnea, palpitations, paroxysmal nocturnal dyspnea. Dermatological: No rash, lesions/masses Respiratory: No cough, dyspnea Urologic: No hematuria, dysuria Abdominal:   No nausea, vomiting, diarrhea, bright red blood per rectum, melena, or hematemesis Neurologic:  No visual changes, wkns, changes in mental status. Positive for dizziness.   All other systems reviewed and are otherwise negative except as noted above.   Physical Exam:    VS:  BP (!) 94/58   Pulse 80   Ht 5' 8.5" (1.74 m)   Wt 136 lb (61.7 kg)   BMI 20.38 kg/m    General: Well developed, thin African American male appearing in no acute distress. Head: Normocephalic, atraumatic, sclera non-icteric, no xanthomas, nares are without discharge.  Neck: No carotid bruits. JVD not  elevated.  Lungs: Respirations regular and unlabored, without wheezes or rales.  Heart: Regular rate and rhythm. No S3 or S4.  No murmur, no rubs, or gallops appreciated. Abdomen: Soft, non-tender, non-distended with normoactive bowel sounds. No hepatomegaly. No rebound/guarding. No obvious abdominal masses. Msk:  Strength and tone appear normal for age. No joint deformities or effusions. Extremities: No clubbing or cyanosis. No lower extremity edema.  Distal pedal pulses are 2+ bilaterally. Neuro: Alert and oriented X 3. Moves all extremities spontaneously. No focal deficits noted. Psych:  Responds to questions appropriately with a normal affect. Skin: No rashes or lesions noted  Wt Readings from Last 3 Encounters:  12/30/16 136 lb (61.7 kg)  12/20/16 137 lb 4.8 oz (62.3 kg)  12/12/16 135 lb 6.4 oz (61.4 kg)     Studies/Labs Reviewed:   EKG:  EKG is not ordered today.   Recent Labs: 10/06/2016: TSH 1.529 12/06/2016: ALT 16; B Natriuretic Peptide 4,216.7 12/12/2016: Hemoglobin 12.5; Magnesium 2.4; Platelets 146 12/20/2016: BUN 46; Creatinine, Ser 2.98; Potassium 4.2; Sodium 139   Lipid Panel No results found for: CHOL, TRIG, HDL, CHOLHDL, VLDL, LDLCALC, LDLDIRECT  Additional studies/ records that were reviewed today include:   Echocardiogram: 10/06/2016 Study Conclusions  - Left ventricle: The cavity size was moderately dilated. Systolic   function was severely reduced. The estimated ejection fraction   was in the range of 20% to 25%. Diffuse hypokinesis. There is   akinesis of the anteroseptal, anterior, inferoseptal, and apical   myocardium. Acoustic contrast opacification revealed no evidence   ofthrombus. - Aortic valve: Valve mobility was restricted. There was moderate   regurgitation. - Mitral valve: Severely thickened leaflets . There was moderate   regurgitation directed posteriorly. - Left atrium: The atrium was mildly dilated. - Right ventricle: The cavity size was  moderately dilated. Wall   thickness was normal. Pacer wire or catheter noted in right   ventricle. - Tricuspid valve: There was mild regurgitation. - Pulmonary arteries: Systolic pressure was mildly increased. PA   peak pressure: 43 mm Hg (S).  Right Heart Cath: 12/09/2016 1. Elevated PCWP in setting of known acute on chronic systolic CHF and mitral regurgitation  Recommendations: Will discuss intracardiac pressures with rounding cardiology team.   Assessment:    1. Chronic combined systolic and diastolic heart failure (HCC)   2. Ischemic cardiomyopathy   3. Coronary artery disease involving native coronary artery of native heart without angina pectoris   4. Hyperlipidemia LDL goal <70   5. CKD (chronic kidney disease) stage 4, GFR 15-29 ml/min (HCC)      Plan:   In order of problems listed above:  1. Chronic Combined Systolic and Diastolic CHF/Ischemic Cardiomyopathy - EF at 20-25% by echo in 10/2016, s/p ICD placement in [place].  - He denies any recent dyspnea on exertion, orthopnea, or PND. Weight has been stable at 135-136 lbs. - Lasix was recently reduced from 40 mg twice a day to 40 mg daily in the setting of his worsening kidney function. He reports only consuming 2-3 sodas per day which is not helping his kidney function. I strongly encouraged the consumption of water. He continues to follow a low-sodium diet. If by mouth intake remains poor, we may further have to reduce his Lasix to 20 mg daily to avoid dehydration.  - He is unable to tolerate beta blocker therapy secondary to baseline bradycardia and hypotension. Not a candidate for ACE-I/ARB/ARNI/Entresto secondary to CKD and hypotension. Unable to tolerate Imdur/Hydralazine due to hypotension, therefore our medication options are very limited. Will make sure EP follow-up has been arranged for him to submit remote device checks. Reports his ICD has never fired.   2. CAD - s/p CABG, performed in [place] 5+ years ago. - he  denies any recent chest discomfort or dyspnea on exertion. - continue ASA 81mg  dialy and Lipitor 40mg  daily. No BB therapy secondary to hypotension and baseline bradycardia.  3. HLD - Followed by PCP. Remains on Atorvastatin 40 mg daily.  4. Acute on Chronic Stage 4 CKD - baseline 1.7 - 1.8. Elevated to 2.98 when most recently checked on 12/20/2016. Lasix reduced to once daily and Imdur  was discontinued secondary to hypertension. He has not yet established care with a Nephrologist here in [place]. This was strongly encouraged.    Medication Adjustments/Labs and Tests Ordered: Current medicines are reviewed at length with the patient today.  Concerns regarding medicines are outlined above.  Medication changes, Labs and Tests ordered today are listed in the Patient Instructions below. Patient Instructions  Medication Instructions:  Your physician recommends that you continue on your current medications as directed. Please refer to the Current Medication list given to you today.  Labwork: NONE  Testing/Procedures: NONE  Follow-Up: Your physician recommends that you schedule a follow-up appointment in: 3 months with Dr. Royann Shivers.  Any Other Special Instructions Will Be Listed Below (If Applicable).  If you need a refill on your cardiac medications before your next appointment, please call your pharmacy.  Signed, Ellsworth Lennox, PA-C  12/31/2016 10:06 AM    [facility] [address] [place], [address] [place], [place]  [address] Phone: [phone]; Fax: [phone]  [address] [place], [place] [address] Phone: [phone]

## 2016-12-29 NOTE — Patient Outreach (Signed)
    Telephone call made to patient. Daughter answered, stated he was out running some errands. HIPPA compliant message left with patient's daughter with request for return call from patient.  Plan: Make another attempt to contact patient in the next 21 days.

## 2016-12-30 ENCOUNTER — Encounter: Payer: Self-pay | Admitting: Student

## 2016-12-30 ENCOUNTER — Ambulatory Visit (INDEPENDENT_AMBULATORY_CARE_PROVIDER_SITE_OTHER): Payer: PPO | Admitting: Student

## 2016-12-30 VITALS — BP 94/58 | HR 80 | Ht 68.5 in | Wt 136.0 lb

## 2016-12-30 DIAGNOSIS — I2510 Atherosclerotic heart disease of native coronary artery without angina pectoris: Secondary | ICD-10-CM | POA: Diagnosis not present

## 2016-12-30 DIAGNOSIS — E785 Hyperlipidemia, unspecified: Secondary | ICD-10-CM

## 2016-12-30 DIAGNOSIS — I5042 Chronic combined systolic (congestive) and diastolic (congestive) heart failure: Secondary | ICD-10-CM | POA: Diagnosis not present

## 2016-12-30 DIAGNOSIS — N184 Chronic kidney disease, stage 4 (severe): Secondary | ICD-10-CM | POA: Diagnosis not present

## 2016-12-30 DIAGNOSIS — I255 Ischemic cardiomyopathy: Secondary | ICD-10-CM | POA: Diagnosis not present

## 2016-12-30 NOTE — Patient Instructions (Signed)
Medication Instructions:  Your physician recommends that you continue on your current medications as directed. Please refer to the Current Medication list given to you today.  Labwork: NONE  Testing/Procedures: NONE  Follow-Up: Your physician recommends that you schedule a follow-up appointment in: 3 months with Dr. Royann Shiversroitoru.   Any Other Special Instructions Will Be Listed Below (If Applicable).     If you need a refill on your cardiac medications before your next appointment, please call your pharmacy.

## 2016-12-31 ENCOUNTER — Encounter: Payer: Self-pay | Admitting: Student

## 2016-12-31 DIAGNOSIS — E785 Hyperlipidemia, unspecified: Secondary | ICD-10-CM | POA: Insufficient documentation

## 2017-01-03 DIAGNOSIS — D61818 Other pancytopenia: Secondary | ICD-10-CM | POA: Diagnosis not present

## 2017-01-03 DIAGNOSIS — E785 Hyperlipidemia, unspecified: Secondary | ICD-10-CM | POA: Diagnosis not present

## 2017-01-03 DIAGNOSIS — I5023 Acute on chronic systolic (congestive) heart failure: Secondary | ICD-10-CM | POA: Diagnosis not present

## 2017-01-03 DIAGNOSIS — Z951 Presence of aortocoronary bypass graft: Secondary | ICD-10-CM | POA: Diagnosis not present

## 2017-01-03 DIAGNOSIS — I051 Rheumatic mitral insufficiency: Secondary | ICD-10-CM | POA: Diagnosis not present

## 2017-01-03 DIAGNOSIS — J449 Chronic obstructive pulmonary disease, unspecified: Secondary | ICD-10-CM | POA: Diagnosis not present

## 2017-01-03 DIAGNOSIS — Z87891 Personal history of nicotine dependence: Secondary | ICD-10-CM | POA: Diagnosis not present

## 2017-01-03 DIAGNOSIS — N189 Chronic kidney disease, unspecified: Secondary | ICD-10-CM | POA: Diagnosis not present

## 2017-01-03 DIAGNOSIS — D696 Thrombocytopenia, unspecified: Secondary | ICD-10-CM | POA: Diagnosis not present

## 2017-01-03 DIAGNOSIS — I2510 Atherosclerotic heart disease of native coronary artery without angina pectoris: Secondary | ICD-10-CM | POA: Diagnosis not present

## 2017-01-03 DIAGNOSIS — I2729 Other secondary pulmonary hypertension: Secondary | ICD-10-CM | POA: Diagnosis not present

## 2017-01-03 DIAGNOSIS — I129 Hypertensive chronic kidney disease with stage 1 through stage 4 chronic kidney disease, or unspecified chronic kidney disease: Secondary | ICD-10-CM | POA: Diagnosis not present

## 2017-01-03 DIAGNOSIS — Z95810 Presence of automatic (implantable) cardiac defibrillator: Secondary | ICD-10-CM | POA: Diagnosis not present

## 2017-01-04 NOTE — Telephone Encounter (Signed)
[facility].Criss AlvineGoldston, Darlene Cassady5/30/201810:50 AM

## 2017-01-05 ENCOUNTER — Telehealth: Payer: Self-pay

## 2017-01-05 NOTE — Telephone Encounter (Signed)
LVM for call back to establish care with EP per [place]. Patient has moved to the area and is not currently followed by anyone in EP at this time. Will plan to have her scheduled with WC at next available.

## 2017-01-09 DIAGNOSIS — I2510 Atherosclerotic heart disease of native coronary artery without angina pectoris: Secondary | ICD-10-CM | POA: Diagnosis not present

## 2017-01-09 DIAGNOSIS — I2729 Other secondary pulmonary hypertension: Secondary | ICD-10-CM | POA: Diagnosis not present

## 2017-01-09 DIAGNOSIS — Z951 Presence of aortocoronary bypass graft: Secondary | ICD-10-CM | POA: Diagnosis not present

## 2017-01-09 DIAGNOSIS — D696 Thrombocytopenia, unspecified: Secondary | ICD-10-CM | POA: Diagnosis not present

## 2017-01-09 DIAGNOSIS — I051 Rheumatic mitral insufficiency: Secondary | ICD-10-CM | POA: Diagnosis not present

## 2017-01-09 DIAGNOSIS — D61818 Other pancytopenia: Secondary | ICD-10-CM | POA: Diagnosis not present

## 2017-01-09 DIAGNOSIS — I5023 Acute on chronic systolic (congestive) heart failure: Secondary | ICD-10-CM | POA: Diagnosis not present

## 2017-01-09 DIAGNOSIS — J449 Chronic obstructive pulmonary disease, unspecified: Secondary | ICD-10-CM | POA: Diagnosis not present

## 2017-01-09 DIAGNOSIS — Z95810 Presence of automatic (implantable) cardiac defibrillator: Secondary | ICD-10-CM | POA: Diagnosis not present

## 2017-01-09 DIAGNOSIS — E785 Hyperlipidemia, unspecified: Secondary | ICD-10-CM | POA: Diagnosis not present

## 2017-01-09 DIAGNOSIS — Z87891 Personal history of nicotine dependence: Secondary | ICD-10-CM | POA: Diagnosis not present

## 2017-01-09 DIAGNOSIS — N189 Chronic kidney disease, unspecified: Secondary | ICD-10-CM | POA: Diagnosis not present

## 2017-01-09 DIAGNOSIS — I129 Hypertensive chronic kidney disease with stage 1 through stage 4 chronic kidney disease, or unspecified chronic kidney disease: Secondary | ICD-10-CM | POA: Diagnosis not present

## 2017-01-10 ENCOUNTER — Other Ambulatory Visit: Payer: Self-pay

## 2017-01-12 ENCOUNTER — Ambulatory Visit: Payer: PPO

## 2017-01-12 ENCOUNTER — Other Ambulatory Visit: Payer: Self-pay

## 2017-01-12 DIAGNOSIS — R809 Proteinuria, unspecified: Secondary | ICD-10-CM | POA: Diagnosis not present

## 2017-01-12 DIAGNOSIS — N183 Chronic kidney disease, stage 3 (moderate): Secondary | ICD-10-CM | POA: Diagnosis not present

## 2017-01-12 DIAGNOSIS — D649 Anemia, unspecified: Secondary | ICD-10-CM | POA: Diagnosis not present

## 2017-01-12 DIAGNOSIS — D696 Thrombocytopenia, unspecified: Secondary | ICD-10-CM | POA: Diagnosis not present

## 2017-01-12 DIAGNOSIS — R828 Abnormal findings on cytological and histological examination of urine: Secondary | ICD-10-CM | POA: Diagnosis not present

## 2017-01-12 NOTE — Patient Outreach (Addendum)
   Telephone call received from patient who advises this RNCM his doctor from [place] called. Patient stated the purpose of his call was to tell this RNCM the workup he received before leaving [place] informed him he has some type of kidney cancer. Patient stated [place] Plan: Home visit later this month

## 2017-01-13 NOTE — Telephone Encounter (Signed)
Staff message sent to Meredith StaggersHeather Schub for outpatient heart failure clinic referal.  [facility] Called to inquire regarded skilled nursing - cardiopulmonary program. Operator will send call to the nurse for review on Monday, 01/16/17.

## 2017-01-14 NOTE — Patient Outreach (Signed)
[facility]) Care Manage 01/12/2017   Darrell Glass Jun 26, 1933 [id number]  This RNCM made home visit to deliver  blood pressure monitoring equipment. Patient was unable to complete visit, stated he forgot he had an appointment with a nephrologist to follow up.  Plan: Contact patient in the next 21 days for community care coordination and to assess patient's progress in reaching his case management goals.

## 2017-01-16 ENCOUNTER — Telehealth: Payer: Self-pay | Admitting: Cardiovascular Disease

## 2017-01-16 NOTE — Telephone Encounter (Signed)
Darrell Glass ( [facility] ) is returning a call about whether or not they have Cardio Pulmonary Program , and she is calling to say that they do have that program and that it may be able to benefit [name] . Please call if you have any question.  Thanks

## 2017-01-16 NOTE — Telephone Encounter (Signed)
Will forward to chelley 

## 2017-01-17 ENCOUNTER — Other Ambulatory Visit: Payer: Self-pay

## 2017-01-17 NOTE — Telephone Encounter (Signed)
Verbal order given for cardiopulmonary rehab.

## 2017-01-17 NOTE — Telephone Encounter (Signed)
Verbal order for cardiopulmonary rehab given. No heart failure appt scheduled at this time.

## 2017-01-18 NOTE — Patient Outreach (Signed)
[facility]) Care Management   01/17/2017  Darrell Glass 03/18/1933 [id number]  Darrell Glass is an 83 y.o. male  Subjective:  I am feeling much better. I have kept all my appointments  Objective:   ROS Well dressed/groomed, small frame elderly gentleman.  Physical Exam ROS  Encounter Medications:   Outpatient Encounter Prescriptions as of 01/17/2017  Medication Sig  . albuterol (PROVENTIL HFA;VENTOLIN HFA) 108 (90 Base) MCG/ACT inhaler Inhale 1-2 puffs into the lungs every 6 (six) hours as needed for wheezing or shortness of breath.  Marland Kitchen atorvastatin (LIPITOR) 40 MG tablet Take 40 mg by mouth at bedtime.  . famotidine (PEPCID) 20 MG tablet Take 20 mg by mouth daily.  . finasteride (PROSCAR) 5 MG tablet Take 5 mg by mouth every evening.  . fluticasone furoate-vilanterol (BREO ELLIPTA) 100-25 MCG/INH AEPB Inhale 1 puff into the lungs every morning.  . furosemide (LASIX) 40 MG tablet Take 1 tablet (40 mg total) by mouth every morning.  . tamsulosin (FLOMAX) 0.4 MG CAPS capsule Take 0.4 mg by mouth daily after breakfast.   No facility-administered encounter medications on file as of 01/17/2017.     Functional Status:   In your present state of health, do you have any difficulty performing the following activities: 12/20/2016 12/06/2016  Hearing? N N  Vision? Y N  Difficulty concentrating or making decisions? Y N  Walking or climbing stairs? Y N  Dressing or bathing? N N  Doing errands, shopping? Y Y    Fall/Depression Screening:    Fall Risk  12/20/2016  Falls in the past year? No   PHQ 2/9 Scores 12/20/2016  PHQ - 2 Score 0   THN CM Care Plan Problem One     Most Recent Value  Care Plan Problem One  patient has recent diagnosis of heart failure with a heart failure diagnosis  Role Documenting the Problem One  Care Management Coordinator  Care Plan for Problem One  Active  THN Long Term Goal   patient will have no more than on acute care visi for heart failure in  the next 31 days.    THN Long Term Goal Start Date  12/21/16  Interventions for Problem One Long Term Goal  home visit for chronic disease education  [facility] CM Short Term Goal #1   Patient will meet with [facility] RNCM for heart failure education  [facility] CM Short Term Goal #1 Start Date  12/21/16  Interventions for Short Term Goal #1  Patient viewed video on Heart Failure, allowed to ask questions regarding video content    [facility] CM Care Plan Problem Two     Most Recent Value  Care Plan Problem Two  patient has incontience  Role Documenting the Problem Two  Care Management Coordinator  Care Plan for Problem Two  Active  THN CM Short Term Goal #1   In the next 28 days, patient will have access to necessary resources/supplies to manage his urinary incontience  [facility]N CM Short Term Goal #1 Start Date  12/21/16  [facility] CM Short Term Goal #1 Met Date   01/18/17  Interventions for Short Term Goal #2   RNCM contacted primary care physician in reference to patient's urinary incontience and the need for incontinent supplies.  [facility] contact in referance to supplying patient incontient supplies     Assessment:   RNCM educated patient on use of his automatic blood pressure cuff. Patient was able to return demonstration on the use of the  equipment. Patient had a recent appointment with a nephrologist, reports to his RNCM he has to go for further testing. Patient denies shortness of breath, no edema noted. Patient awaiting delivery of scale for daily weights. Patient and RNCM viewed EMMI video on Heart Failure, Low Sodium Diet  Plan:  Telephone contact in the next 21 days for assessment of community care coordination needs, need for chronic disease educaiton

## 2017-01-20 ENCOUNTER — Ambulatory Visit (HOSPITAL_COMMUNITY)
Admission: RE | Admit: 2017-01-20 | Discharge: 2017-01-20 | Disposition: A | Discharge status: Home / Self Care | Payer: PPO | Source: Ambulatory Visit | Attending: Internal Medicine | Admitting: Internal Medicine

## 2017-01-20 ENCOUNTER — Encounter (HOSPITAL_COMMUNITY): Payer: Self-pay | Admitting: Internal Medicine

## 2017-01-20 VITALS — BP 124/60 | HR 82 | Wt 141.5 lb

## 2017-01-20 DIAGNOSIS — I130 Hypertensive heart and chronic kidney disease with heart failure and stage 1 through stage 4 chronic kidney disease, or unspecified chronic kidney disease: Secondary | ICD-10-CM | POA: Diagnosis not present

## 2017-01-20 DIAGNOSIS — I5022 Chronic systolic (congestive) heart failure: Secondary | ICD-10-CM | POA: Insufficient documentation

## 2017-01-20 DIAGNOSIS — I2510 Atherosclerotic heart disease of native coronary artery without angina pectoris: Secondary | ICD-10-CM | POA: Insufficient documentation

## 2017-01-20 DIAGNOSIS — N184 Chronic kidney disease, stage 4 (severe): Secondary | ICD-10-CM | POA: Diagnosis not present

## 2017-01-20 DIAGNOSIS — J449 Chronic obstructive pulmonary disease, unspecified: Secondary | ICD-10-CM | POA: Diagnosis not present

## 2017-01-20 DIAGNOSIS — Z9981 Dependence on supplemental oxygen: Secondary | ICD-10-CM | POA: Insufficient documentation

## 2017-01-20 DIAGNOSIS — Z87891 Personal history of nicotine dependence: Secondary | ICD-10-CM | POA: Insufficient documentation

## 2017-01-20 DIAGNOSIS — Z7982 Long term (current) use of aspirin: Secondary | ICD-10-CM | POA: Insufficient documentation

## 2017-01-20 DIAGNOSIS — Z951 Presence of aortocoronary bypass graft: Secondary | ICD-10-CM | POA: Insufficient documentation

## 2017-01-20 NOTE — Progress Notes (Signed)
[facility] Clinic Consult Note   Referring Physician: Dr. Royann Glass Primary Care: Dr. Concepcion Glass  Primary Cardiologist: Dr. Royann Glass  HPI: [name] is a 83 year old male with a past medical history of CAD (s/p CABG in [place]), ICM (EF 20-25% in 10/2016), s/p ICD, HTN, HLD, COPD, stage 3 CKD.   [place] Referred to HF Clinic by Dr. Rubie Glass for further evaluation of advanced HF.  He was [facility] - 12/12/2016 for acute on chronic HF. On admit he was 10kg up from his previous dry wt (61 kg). Diuresed 5kg with IV lasix then creatiine started to bump so a RHC was performed on 5/4.  RHC showed relatively well-compensated hemodynamics (see below) wth PCWP 21 and normal cardiac output.Marland Kitchen. He was diuresed further and eventually switched to PO Lasix 40mg  BID. At time of discharge his weight was135 lbs (61.3 kg). He was not placed on an ACE-I/ARB during admission and his PTA Spironolactone was discontinued as his creatinine was 2.3.  Imdur 15mg  daily was added to his medication regimen. Carvedilol was stopped.   Seen in IM residents clinic on 5/15 and creatinine bumped from 2.2 -> 2.9. Felt to be possibly ATN so Imdur stopped.  He presents today for HF evaluation. His daughter is with him, he lives with her. He is largely inactive at baseline, uses an electric wheelchair when traveling outside his home. Wears home O2, has for many years, due to COPD. He is SOB when walking throughout his home, endorses orthopnea. Says he can walk to the mailbox some days without SOB but his daughter says he has to stop at least once.. Drinks less than 2L a day, eats a fairly low salt diet. He feels well most days, but has days when he has lower extremity edema and early satiety. He is concerned today about a urine cytology that was suspicious for cancer.    Right Heart Cath 12/09/16  Most Recent Value  Fick Cardiac Output 5.06 L/min  Fick Cardiac Output Index  2.81 (L/min)/BSA  RA A Wave 6 mmHg  RA V Wave 9 mmHg  RA Mean 7 mmHg  RV Systolic Pressure 36 mmHg  RV Diastolic Pressure 4 mmHg  RV EDP 8 mmHg  PA Systolic Pressure 38 mmHg  PA Diastolic Pressure 19 mmHg  PA Mean 25 mmHg  PW A Wave 19 mmHg  PW V Wave 24 mmHg  PW Mean 21 mmHg  QP/QS 1  TPVR Index 8.89 HRUI    Review of Systems: [y] = yes, [ ]  = no   General: Weight gain [ y]; Weight loss [ ] ; Anorexia [ ] ; Fatigue Cove.Etienne[y ]; Fever [ ] ; Chills [ ] ; Weakness [ ]   Cardiac: Chest pain/pressure [ ] ; Resting SOB [ ] ; Exertional SOB Cove.Etienne[y ]; Orthopnea Cove.Etienne[y ]; Pedal Edema [ ] ; Palpitations [ ] ; Syncope [ ] ; Presyncope [ ] ; Paroxysmal nocturnal dyspnea[ ]   Pulmonary: Cough [ ] ; Wheezing[ ] ; Hemoptysis[ ] ; Sputum [ ] ; Snoring [ ]   GI: Vomiting[ ] ; Dysphagia[ ] ; Melena[ ] ; Hematochezia [ ] ; Heartburn[ ] ; Abdominal pain [ ] ; Constipation [ ] ; Diarrhea [ ] ; BRBPR [ ]   GU: Hematuria[ ] ; Dysuria [ ] ; Nocturia[ ]   Vascular: Pain in legs with walking [ ] ; Pain in feet with lying flat [ ] ; Non-healing sores [ ] ; Stroke [ ] ; TIA [ ] ; Slurred speech [ ] ;  Neuro: Headaches[ ] ; Vertigo[ ] ; Seizures[ ] ; Paresthesias[ ] ;Blurred vision [ ] ;  Diplopia [ ] ; Vision changes [ ]   Ortho/Skin: Arthritis Cove.Etienne ]; Joint pain Cove.Etienne ]; Muscle pain [ ] ; Joint swelling [ ] ; Back Pain [ ] ; Rash [ ]   Psych: Depression[ ] ; Anxiety[ ]   Heme: Bleeding problems [ ] ; Clotting disorders [ ] ; Anemia [ ]   Endocrine: Diabetes [ ] ; Thyroid dysfunction[ ]    Past Medical History:  Diagnosis Date  . AICD (automatic cardioverter/defibrillator) present   . Arthritis    "small of my back" (10/06/2016)  . CHF (congestive heart failure) (HCC)    a. EF 20-25% by echo in 10/2016, ICD in place.   . Chronic kidney disease (CKD), stage III (moderate)    stage III or IV/notes 10/06/2016  . COPD (chronic obstructive pulmonary disease) (HCC)   . Coronary artery disease    a. s/p CABG --> performed in [place]  . High cholesterol   . Hypertension   . Iron  deficiency anemia    "takes iron pill" (10/06/2016)  . Myocardial infarction [facility])    "led to triple bypass & defibrillator"   . On home oxygen therapy    "just relocated this weekend from [place]; he was on O2 there; don't have any here" (10/06/2016)  . Pneumonia    "@ least twice" (10/06/2016)    Current Outpatient Prescriptions  Medication Sig Dispense Refill  . albuterol (PROVENTIL HFA;VENTOLIN HFA) 108 (90 Base) MCG/ACT inhaler Inhale 1-2 puffs into the lungs every 6 (six) hours as needed for wheezing or shortness of breath.    Marland Kitchen atorvastatin (LIPITOR) 40 MG tablet Take 40 mg by mouth at bedtime.    . famotidine (PEPCID) 20 MG tablet Take 20 mg by mouth daily.    . finasteride (PROSCAR) 5 MG tablet Take 5 mg by mouth every evening.    . fluticasone furoate-vilanterol (BREO ELLIPTA) 100-25 MCG/INH AEPB Inhale 1 puff into the lungs every morning.    . furosemide (LASIX) 40 MG tablet Take 1 tablet (40 mg total) by mouth every morning. 90 tablet 1  . tamsulosin (FLOMAX) 0.4 MG CAPS capsule Take 0.4 mg by mouth daily after breakfast.     No current facility-administered medications for this encounter.     No Known Allergies    Social History   Social History  . Marital status: Divorced    Spouse name: N/A  . Number of children: N/A  . Years of education: N/A   Occupational History  . Not on file.   Social History Main Topics  . Smoking status: Former Smoker    Packs/day: 2.00    Years: 71.00    Types: Cigarettes  . Smokeless tobacco: Never Used     Comment: 10/06/2016 "stopped ~ 09/2015"  . Alcohol use No     Comment: 10/06/2016 "nothing for years and years"  . Drug use: No  . Sexual activity: Not on file   Other Topics Concern  . Not on file   Social History Narrative  . No narrative on file      Family History  Problem Relation Age of Onset  . Diabetes Mother   . CAD Neg Hx   . Diabetes Mellitus II Neg Hx     Vitals:   01/20/17 1237  BP: 124/60  Pulse: 82  SpO2:  100%  Weight: 141 lb 8 oz (64.2 kg)     PHYSICAL EXAM: General: Elderly male. Frail appearing. NAD. Arrived in wheelchair. Wearing O2 HEENT: normal Neck: supple. 5-6 JVD. Carotids 2+ bilat; no bruits. No lymphadenopathy  or thyromegaly appreciated. Cor: PMI nondisplaced. Regular rate & rhythm. No rubs, gallops. 2/6 AS murmur. Lungs: clear with decreased BS throughout. No wheeze Abdomen: soft, nontender, nondistended. No hepatosplenomegaly. No bruits or masses. Good bowel sounds. Extremities: no cyanosis, clubbing, rash, edema Neuro: alert & oriented x 3, cranial nerves grossly intact. moves all 4 extremities w/o difficulty. Affect pleasant   ASSESSMENT & PLAN: 1. Chronic systolic CHF: Echo 3/18 EF 20-25%, ICM - NYHA III - Volume status stable to dry on exam. Continue lasix 40mg  daily. Overall, he really is doing well. He is able to do most of the things he wants to do. No SOB.  - No Cleda Daub, ACE/ARB/ARNI with CKD.  -Carvedilol stopped - We provided him with a scale and education about daily weights. Told him to call our office if his weight increases by more than 3 pounds in one day or 5 pounds in one week.  2. COPD - wears home O2.  - Stable.  3. CKD stage III - Follows with Dr. Signe Colt  - Creatinine 2.98. (Previous baseline appears 1.8-2.2) 4. History of CAD s/p CABG - Denies chest pain.  - Continue daily ASA  Follow up in 4 months.   Darrell Ishikawa, NP 01/20/17   Patient seen and examined with Suzzette Righter, NP. We discussed all aspects of the encounter. I agree with the assessment and plan as stated above.   Frail 83 y/o male with advanced HF due to severe iCM. Also with COPD and CKD IV. Only mildly ambulatory and has had several admits over past few months prompting family to move him from [place], [place] to [place].   Currently volume status is ok but renal function very tenuous with creatinine recently up to 3.0. Most of his HF meds have been stopped due to renal failure or weakness.  Currently not on any neurohormonal modulators. I suspect he is nearing the end of his life. Based on age and comorbidities he is not candidate for advanced therapies. Ideally would like to get him on low-dose Bidil but not sure he will be able to tolerate this.   For now, focus will be on managing his volume status closely and getting him enrolled in a cardiopulmonary rehab program. We have provided him with a scale today and educated on HF management. May benefit from paramedicine of [facility] follow-up. If BP and renal function stable can try Bidil half tablet BID or TID.   Will recheck BMET today. Refer to device clinic for device monitoring and enrollment into ICM program.   Arvilla Meres, MD  5:59 PM

## 2017-01-20 NOTE — Patient Instructions (Signed)
You have been referred to Dr Graciela HusbandsKlein  We will contact you in 4 months to schedule your next appointment.   Do the following things EVERYDAY: 1) Weigh yourself in the morning before breakfast. Write it down and keep it in a log. 2) Take your medicines as prescribed 3) Eat low salt foods-Limit salt (sodium) to 2000 mg per day.  4) Stay as active as you can everyday 5) Limit all fluids for the day to less than 2 liters

## 2017-01-26 ENCOUNTER — Other Ambulatory Visit: Payer: Self-pay

## 2017-01-27 ENCOUNTER — Other Ambulatory Visit: Payer: Self-pay

## 2017-01-27 NOTE — Patient Outreach (Signed)
[facility]) Care Management  01/26/2017   Dagan Heinz 19-Aug-1932 [id number]  Subjective:  I have been weighing everyday and taking my blood pressure.  Objective:  Telephone encounter, patient appears to be moving towards meeting his case management goals.  Current Medications:  Current Outpatient Prescriptions  Medication Sig Dispense Refill  . albuterol (PROVENTIL HFA;VENTOLIN HFA) 108 (90 Base) MCG/ACT inhaler Inhale 1-2 puffs into the lungs every 6 (six) hours as needed for wheezing or shortness of breath.    Marland Kitchen atorvastatin (LIPITOR) 40 MG tablet Take 40 mg by mouth at bedtime.    . famotidine (PEPCID) 20 MG tablet Take 20 mg by mouth daily.    . finasteride (PROSCAR) 5 MG tablet Take 5 mg by mouth every evening.    . fluticasone furoate-vilanterol (BREO ELLIPTA) 100-25 MCG/INH AEPB Inhale 1 puff into the lungs every morning.    . furosemide (LASIX) 40 MG tablet Take 1 tablet (40 mg total) by mouth every morning. 90 tablet 1  . tamsulosin (FLOMAX) 0.4 MG CAPS capsule Take 0.4 mg by mouth daily after breakfast.     No current facility-administered medications for this visit.     Functional Status:  In your present state of health, do you have any difficulty performing the following activities: 12/20/2016 12/06/2016  Hearing? N N  Vision? Y N  Difficulty concentrating or making decisions? Y N  Walking or climbing stairs? Y N  Dressing or bathing? N N  Doing errands, shopping? Y Y    Fall/Depression Screening: Fall Risk  12/20/2016  Falls in the past year? No   PHQ 2/9 Scores 12/20/2016  PHQ - 2 Score 0   THN CM Care Plan Problem One     Most Recent Value  Care Plan Problem One  patient has recent diagnosis of heart failure with a heart failure diagnosis  Role Documenting the Problem One  Care Management [place] for Problem One  Active  THN Long Term Goal   patient will have no more than on acute care visi for heart failure in the next 31  days.    THN Long Term Goal Start Date  12/21/16  THN Long Term Goal Met Date  01/26/17  Interventions for Problem One Long Term Goal  6/21 patient has had no acute care admissions in the last 31 days  THN CM Short Term Goal #1   Patient will meet with [facility] RNCM for heart failure education  [facility] CM Short Term Goal #1 Start Date  12/21/16  [facility] CM Short Term Goal #1 Met Date  01/26/17  Interventions for Short Term Goal #1  6/21 Patient met with patient on 6/12  for heart failure education    [facility] CM Care Plan Problem Two     Most Recent Value  Care Plan Problem Two  patient has incontience  Role Documenting the Problem Two  Care Management Coordinator  Care Plan for Problem Two  Active  THN CM Short Term Goal #1   In the next 28 days, patient will have access to necessary resources/supplies to manage his urinary incontience  [facility]N CM Short Term Goal #1 Start Date  12/21/16  [facility] CM Short Term Goal #1 Met Date   01/18/17  Interventions for Short Term Goal #2   RNCM contacted primary care physician in reference to patient's urinary incontience and the need for incontinent supplies.  [facility] contact in referance to supplying patient incontient supplies  Assessment:  Patient admits to daily weighing and taking his blood pressures. Patient denies breathing difficulties, swelling. Patient was able to verbally report his heart failure action plan.  Plan:  Contact with patient in the next 21 days for assessment of community care coordination.

## 2017-01-27 NOTE — Patient Outreach (Signed)
    Telephone call from [place], Admissions Coordinator at [facility]. Drenda FreezeFran advised this RNCM she has received a [facility] for this [facility]. This RNCM agreed with referral, added this RNCM would assist in any way with the referral. Drenda FreezeFran agreed to contact this RNCM if further assistance is needed, or if the referral is accepted by patient.  Plan: Contact patient in the next 28 to assess patient's progress in meeting his case management goals.

## 2017-01-30 DIAGNOSIS — Z95810 Presence of automatic (implantable) cardiac defibrillator: Secondary | ICD-10-CM | POA: Diagnosis not present

## 2017-01-30 DIAGNOSIS — I2510 Atherosclerotic heart disease of native coronary artery without angina pectoris: Secondary | ICD-10-CM | POA: Diagnosis not present

## 2017-01-30 DIAGNOSIS — Z951 Presence of aortocoronary bypass graft: Secondary | ICD-10-CM | POA: Diagnosis not present

## 2017-01-30 DIAGNOSIS — D696 Thrombocytopenia, unspecified: Secondary | ICD-10-CM | POA: Diagnosis not present

## 2017-01-30 DIAGNOSIS — N189 Chronic kidney disease, unspecified: Secondary | ICD-10-CM | POA: Diagnosis not present

## 2017-01-30 DIAGNOSIS — I2729 Other secondary pulmonary hypertension: Secondary | ICD-10-CM | POA: Diagnosis not present

## 2017-01-30 DIAGNOSIS — I129 Hypertensive chronic kidney disease with stage 1 through stage 4 chronic kidney disease, or unspecified chronic kidney disease: Secondary | ICD-10-CM | POA: Diagnosis not present

## 2017-01-30 DIAGNOSIS — E785 Hyperlipidemia, unspecified: Secondary | ICD-10-CM | POA: Diagnosis not present

## 2017-01-30 DIAGNOSIS — Z87891 Personal history of nicotine dependence: Secondary | ICD-10-CM | POA: Diagnosis not present

## 2017-01-30 DIAGNOSIS — J449 Chronic obstructive pulmonary disease, unspecified: Secondary | ICD-10-CM | POA: Diagnosis not present

## 2017-01-30 DIAGNOSIS — I5023 Acute on chronic systolic (congestive) heart failure: Secondary | ICD-10-CM | POA: Diagnosis not present

## 2017-01-30 DIAGNOSIS — I051 Rheumatic mitral insufficiency: Secondary | ICD-10-CM | POA: Diagnosis not present

## 2017-01-30 DIAGNOSIS — D61818 Other pancytopenia: Secondary | ICD-10-CM | POA: Diagnosis not present

## 2017-02-06 DIAGNOSIS — Z951 Presence of aortocoronary bypass graft: Secondary | ICD-10-CM | POA: Diagnosis not present

## 2017-02-06 DIAGNOSIS — D61818 Other pancytopenia: Secondary | ICD-10-CM | POA: Diagnosis not present

## 2017-02-06 DIAGNOSIS — I5023 Acute on chronic systolic (congestive) heart failure: Secondary | ICD-10-CM | POA: Diagnosis not present

## 2017-02-06 DIAGNOSIS — I129 Hypertensive chronic kidney disease with stage 1 through stage 4 chronic kidney disease, or unspecified chronic kidney disease: Secondary | ICD-10-CM | POA: Diagnosis not present

## 2017-02-06 DIAGNOSIS — E785 Hyperlipidemia, unspecified: Secondary | ICD-10-CM | POA: Diagnosis not present

## 2017-02-06 DIAGNOSIS — N189 Chronic kidney disease, unspecified: Secondary | ICD-10-CM | POA: Diagnosis not present

## 2017-02-06 DIAGNOSIS — I2510 Atherosclerotic heart disease of native coronary artery without angina pectoris: Secondary | ICD-10-CM | POA: Diagnosis not present

## 2017-02-06 DIAGNOSIS — I051 Rheumatic mitral insufficiency: Secondary | ICD-10-CM | POA: Diagnosis not present

## 2017-02-06 DIAGNOSIS — Z95810 Presence of automatic (implantable) cardiac defibrillator: Secondary | ICD-10-CM | POA: Diagnosis not present

## 2017-02-06 DIAGNOSIS — I2729 Other secondary pulmonary hypertension: Secondary | ICD-10-CM | POA: Diagnosis not present

## 2017-02-06 DIAGNOSIS — J449 Chronic obstructive pulmonary disease, unspecified: Secondary | ICD-10-CM | POA: Diagnosis not present

## 2017-02-06 DIAGNOSIS — D696 Thrombocytopenia, unspecified: Secondary | ICD-10-CM | POA: Diagnosis not present

## 2017-02-06 DIAGNOSIS — Z87891 Personal history of nicotine dependence: Secondary | ICD-10-CM | POA: Diagnosis not present

## 2017-02-07 ENCOUNTER — Other Ambulatory Visit: Payer: Self-pay

## 2017-02-08 NOTE — Patient Outreach (Signed)
   This RNCM made an acute care visit after receiving a telephone call from patient's daughter, Darrell Glass. Darrell Glass reports noticing a steady decline in her father with him having a decrease in his appetite, more difficulty breathing. This RNCM made call to [place], [facility] to advise her of daughters report.  Home visit made with Drenda FreezeFran and [facility] from [facility]. Drenda FreezeFran explained to daughter, patient and daughter's husband the services offered by [facility]. Drenda FreezeFran also advised all she thinks patient would, at this time, benefit from [facility]. Patient and family were informed more of the services offered including CNA, LCSW and incontinent supplies.    Plan: Care Coordination telephone call for information on patient's decision on end of life care.

## 2017-02-09 ENCOUNTER — Other Ambulatory Visit: Payer: Self-pay

## 2017-02-09 NOTE — Patient Outreach (Signed)
[facility]N) Care Management  02/09/2017  Darrell Glass 12-Jun-1933 [id number]   Telephone contact with patient's daughter, [name] (patient's primary caregiver). [name] advised this RNCM she and patient  has accepted the Hosp[facility] Benefit, services has already started. [name] also advises she noticed patient is sleeping more, is calling for his mother. This RNCM offered emotional support as [name] verbalized the changes she noticed in her father.  Plan: Discharge patient from caseload, patient's case management is [facility] [place]

## 2017-02-22 ENCOUNTER — Institutional Professional Consult (permissible substitution): Payer: PPO | Admitting: Internal Medicine

## 2017-03-08 DEATH — deceased

## 2017-03-31 ENCOUNTER — Encounter: Payer: PPO | Admitting: Cardiovascular Disease

## 2018-07-31 IMAGING — DX DG CHEST 2V
2 series · 2 of 2 positions shown · non-contrast
Comparison: None.

CLINICAL DATA: Chronic shortness of breath.

EXAM:
CHEST  2 VIEW

[chest lat]
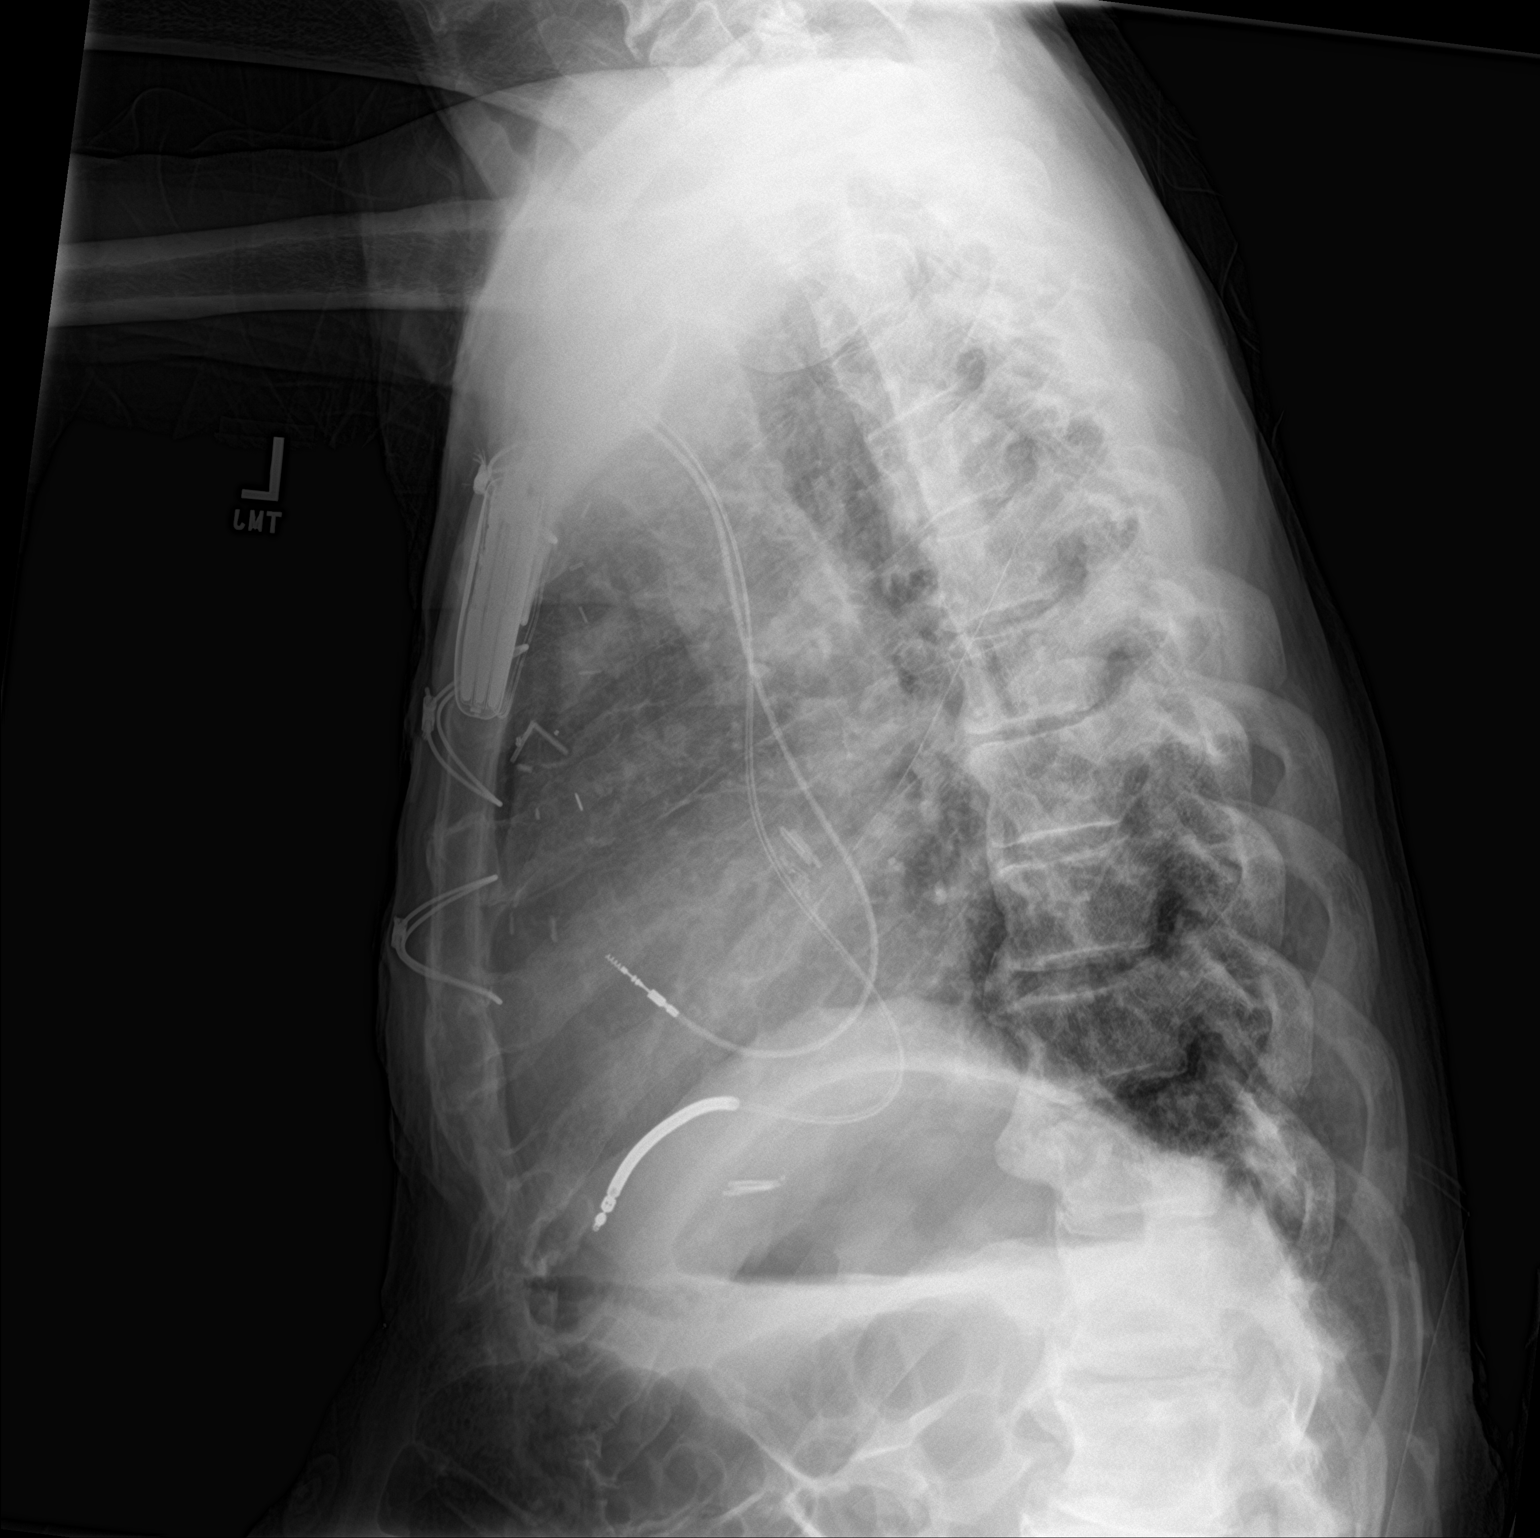

[chest ap]
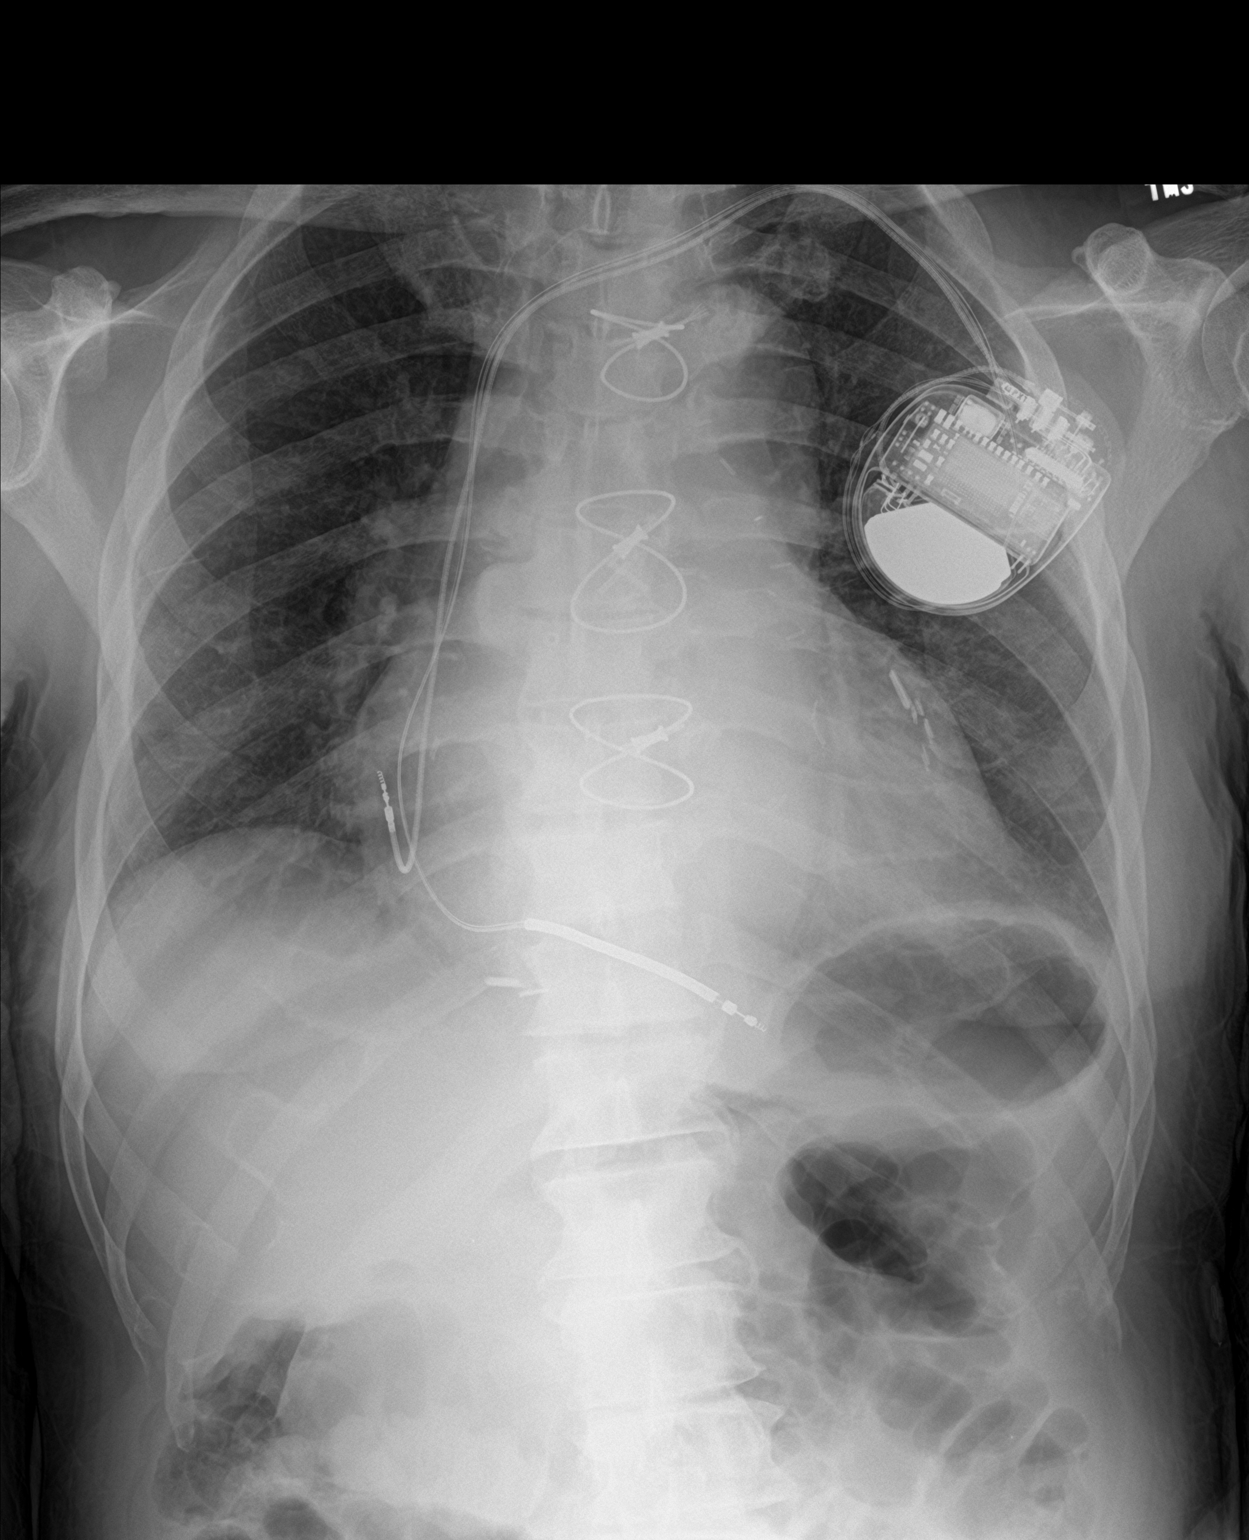

[2 of 2 positions shown; findings below may reference images not displayed]

FINDINGS: Permanent left-sided pacemaker with pacer wires in good position
without complicating features. Surgical changes from bypass surgery.
The heart is enlarged. There is tortuosity, ectasia and
calcification of the thoracic aorta. The lungs are grossly clear. No
infiltrates or effusions. Chronic appearing bronchitic changes. The
upper abdominal bowel gas pattern is unremarkable. The bony
structures are intact.
IMPRESSION: Cardiac enlargement but no acute pulmonary findings.

## 2018-10-01 IMAGING — DX DG CHEST 2V
2 series · 2 of 2 positions shown · non-contrast
Comparison: October 05, 2016

CLINICAL DATA: Bronchitis for a few months with shortness of
breath.

EXAM:
CHEST  2 VIEW

[chest pa]
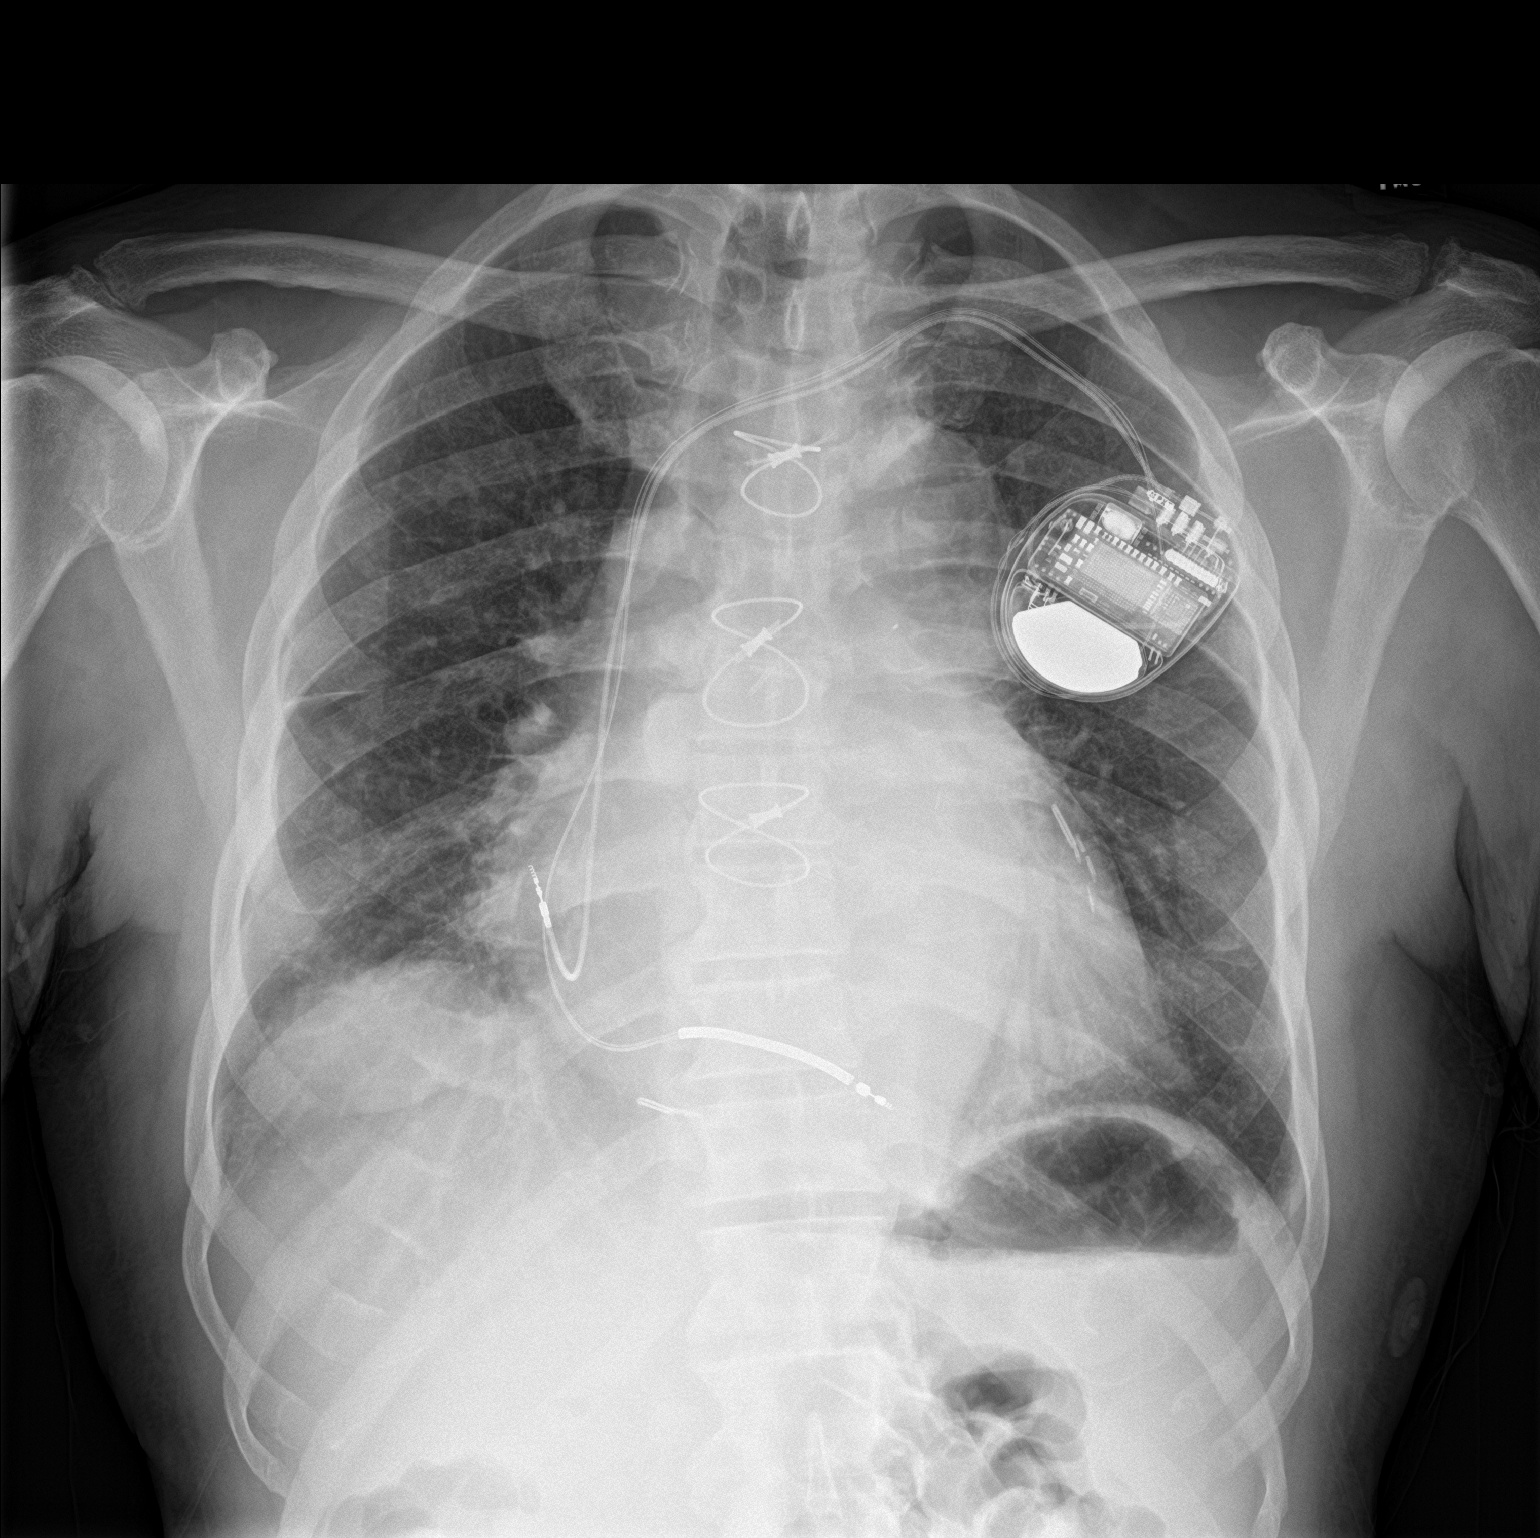

[chest lat]
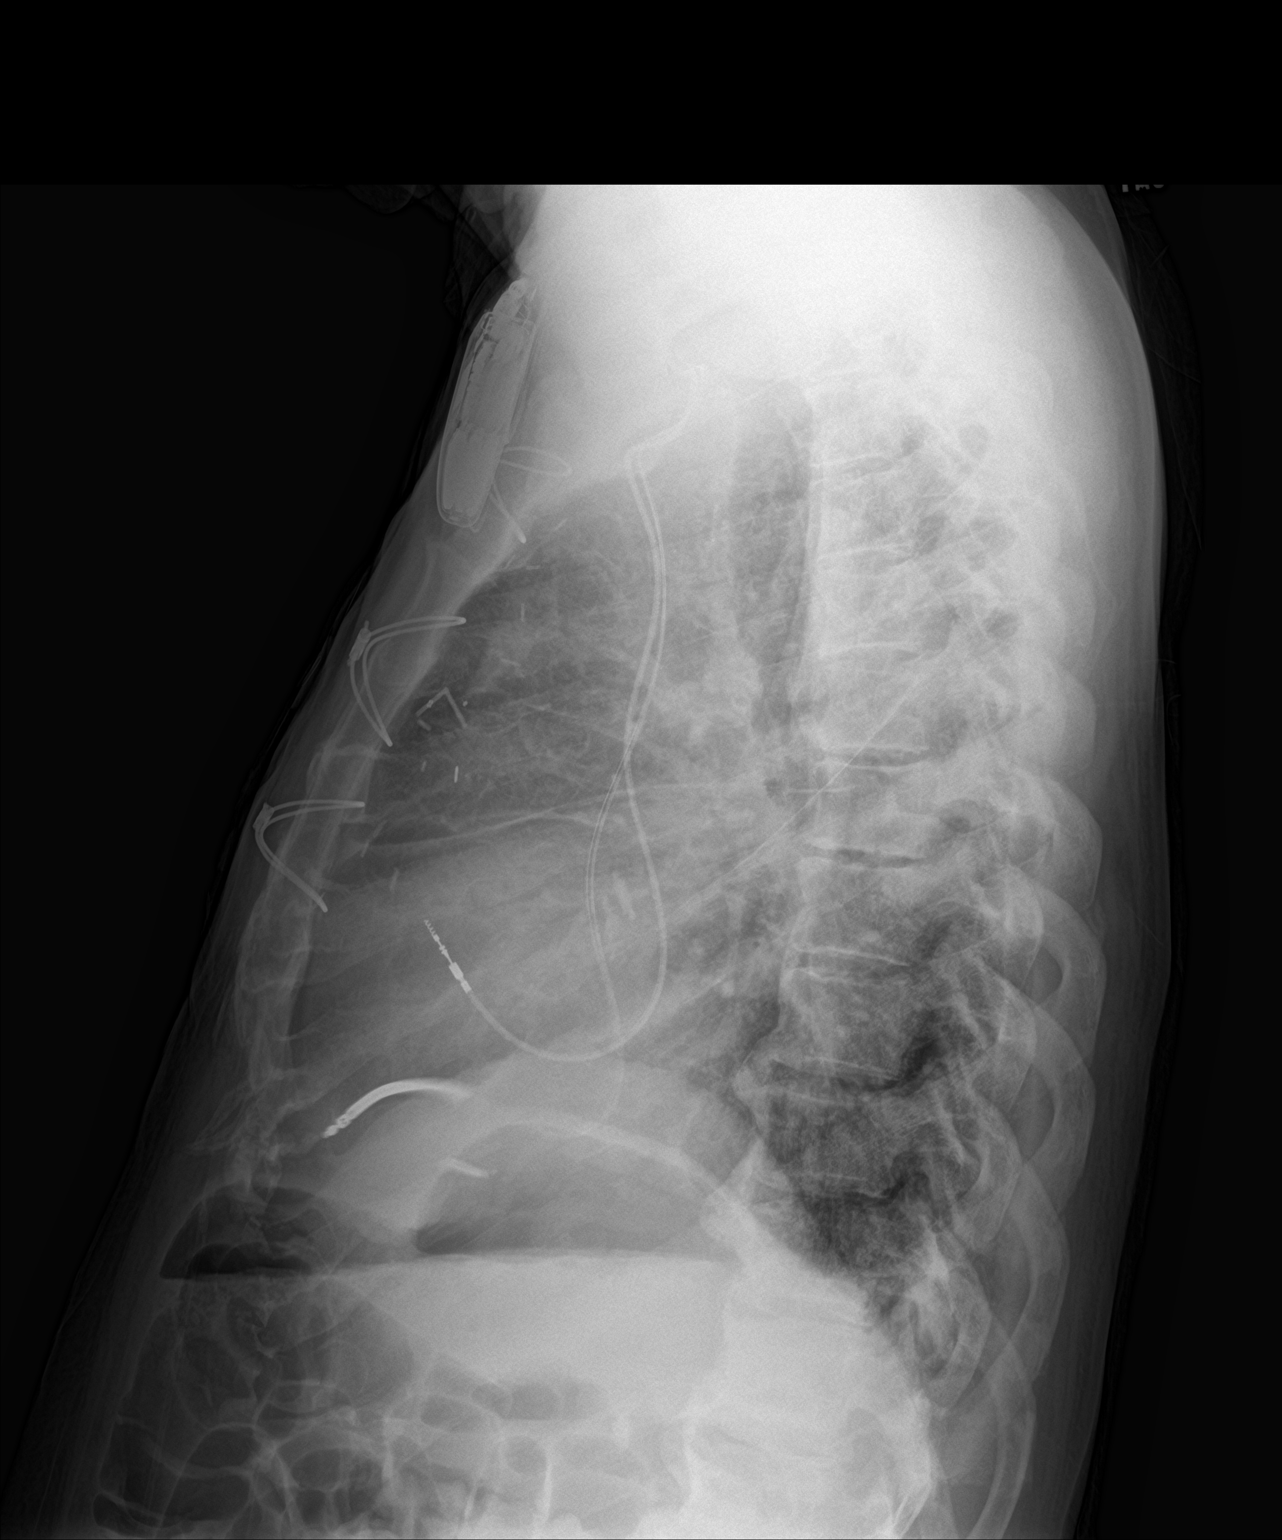

[2 of 2 positions shown; findings below may reference images not displayed]

FINDINGS: Stable cardiomegaly an AICD device. The hila and mediastinum are
unchanged. No pneumothorax. No pulmonary nodules or masses. No focal
infiltrate.
IMPRESSION: No active cardiopulmonary disease.

## 2018-12-03 IMAGING — US US RENAL
1 series · 14 of 25 positions shown · non-contrast
Comparison: None.

CLINICAL DATA: Acute kidney injury.  Inpatient.

EXAM:
RENAL / URINARY TRACT ULTRASOUND COMPLETE

[Series 1: us renal · 0.23mm/px · 14 of 40 slices shown]
[im 1/40]
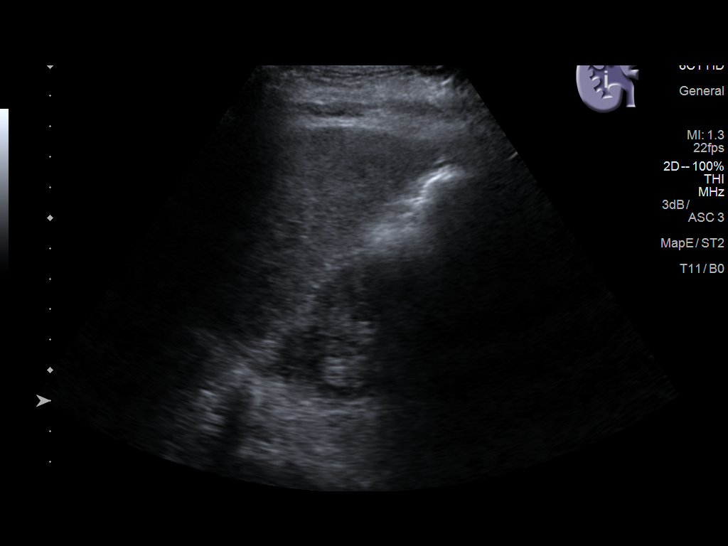
[im 4/40]
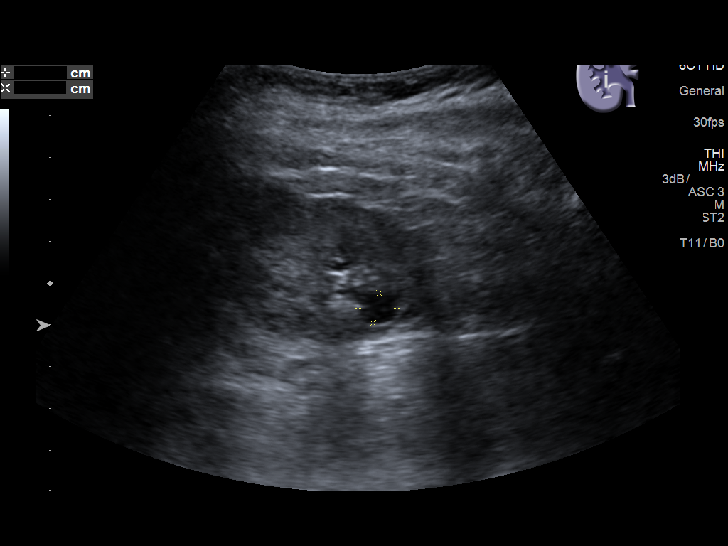
[im 7/40]
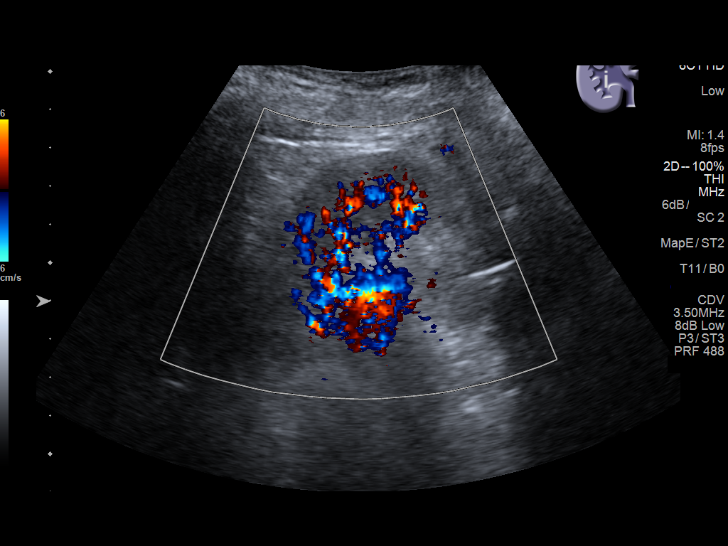
[im 10/40]
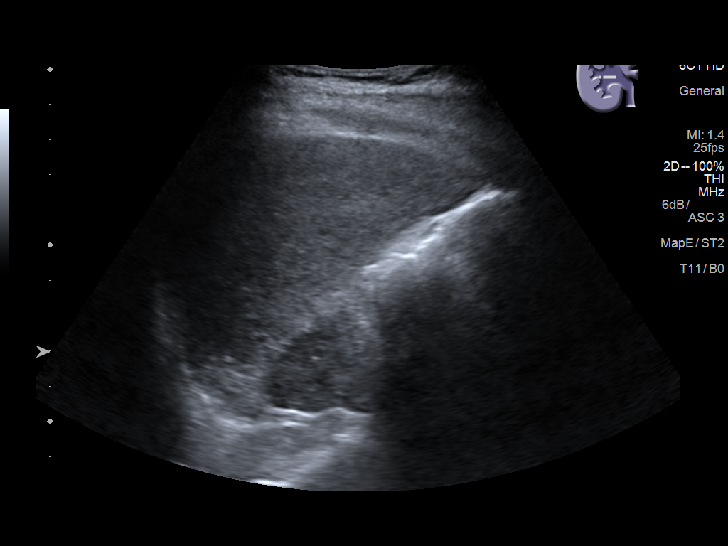
[im 14/40]
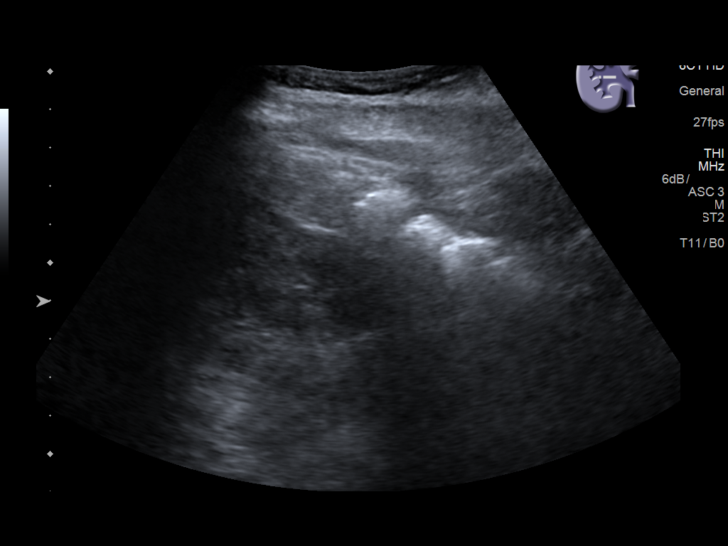
[im 15/40]
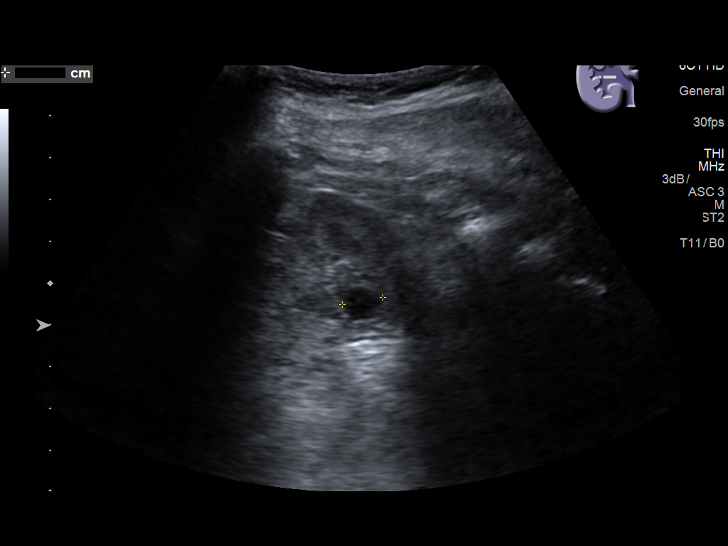
[im 18/40]
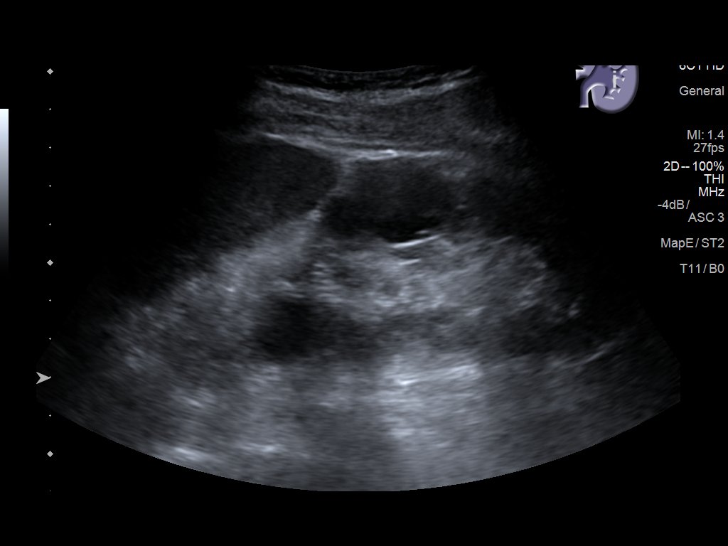
[im 22/40]
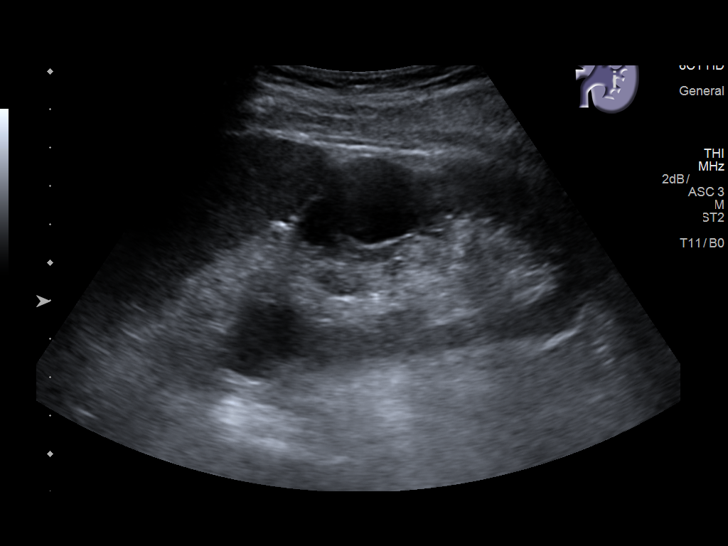
[im 25/40]
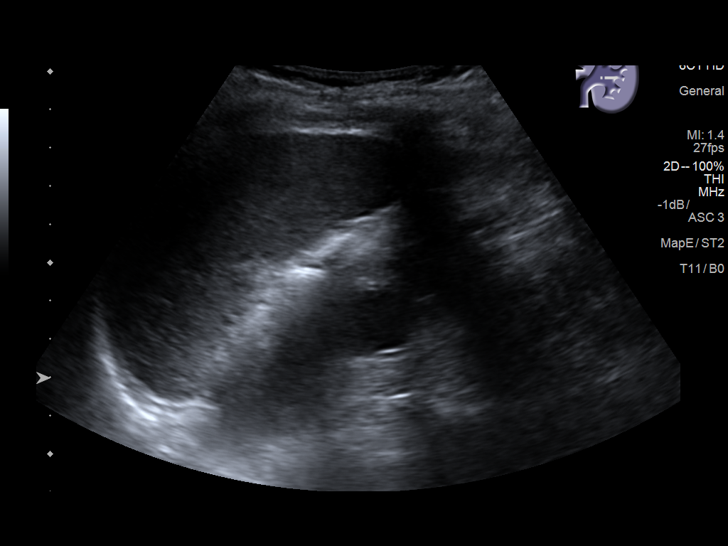
[im 27/40]
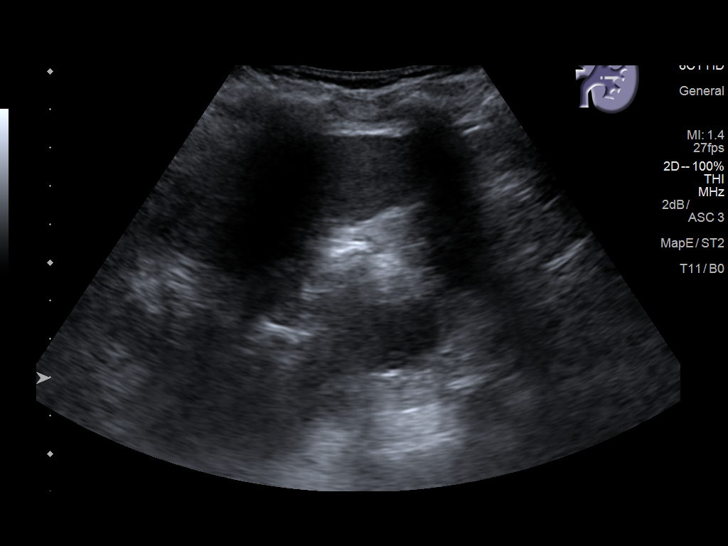
[im 30/40]
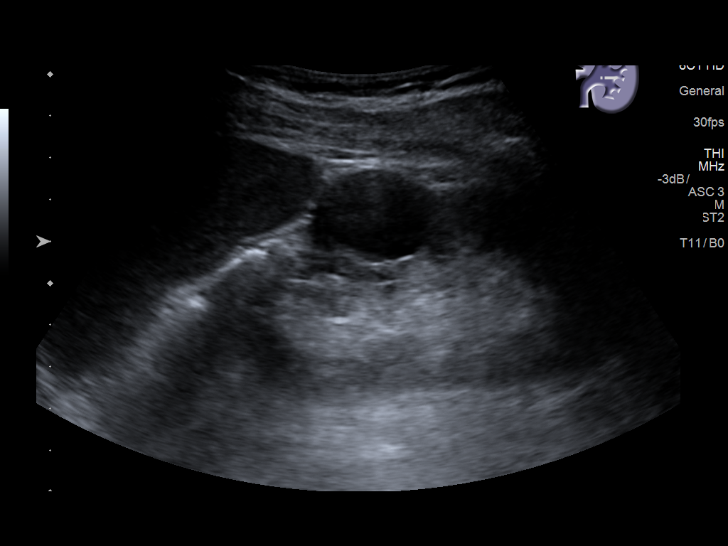
[im 33/40]
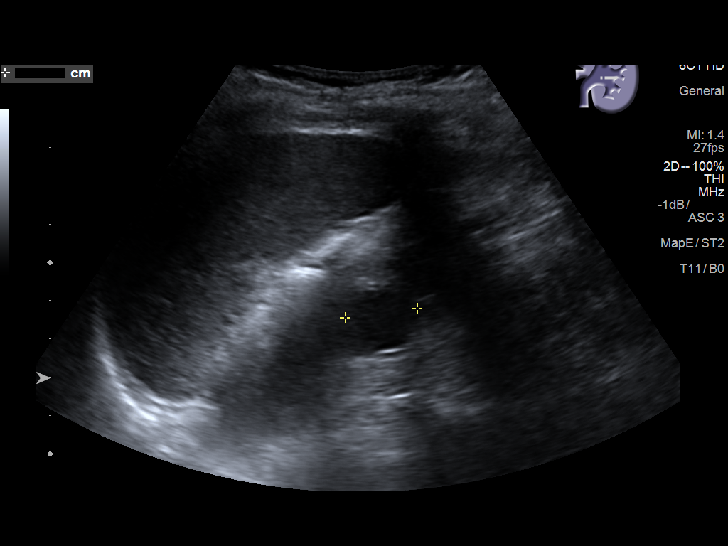
[im 36/40]
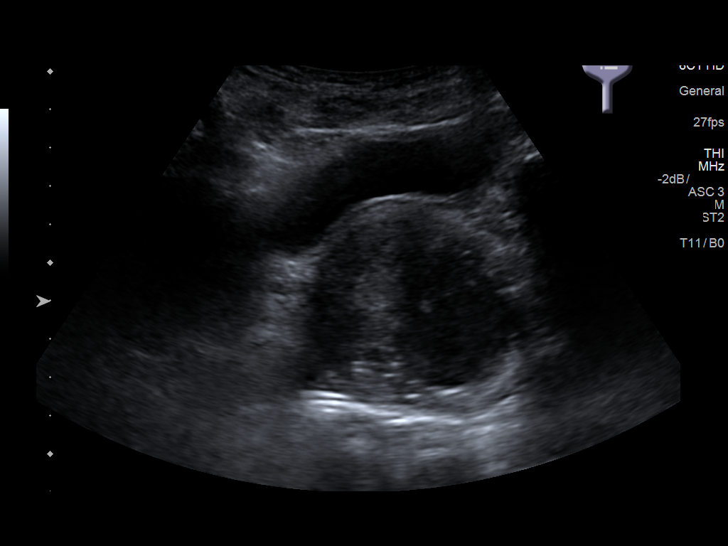
[im 40/40]
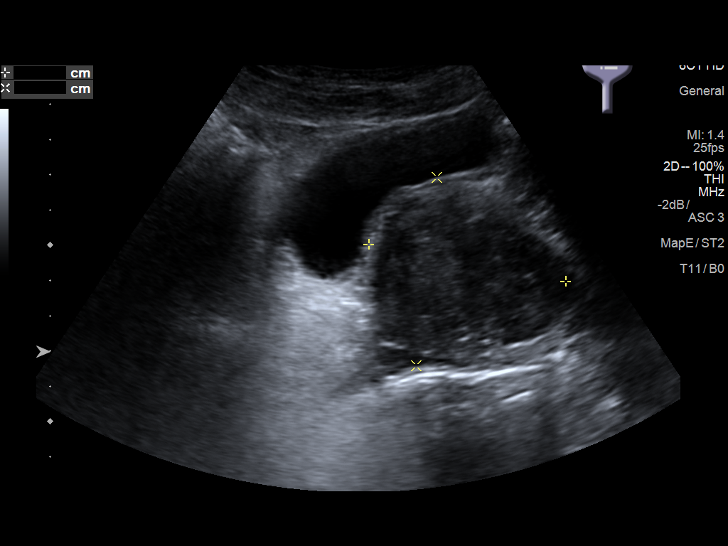

[14 of 25 positions shown; findings below may reference images not displayed]

FINDINGS: Right Kidney:

Length: 8.8 cm. No right hydronephrosis. Mildly echogenic right
renal parenchyma, which is normal in thickness. Simple 0.9 x 0.7 x
1.0 cm renal cyst in the lower right kidney.

Left Kidney:

Length: 10.7 cm. No left hydronephrosis. Mildly echogenic left renal
parenchyma, which is normal in thickness. Simple 1.9 x 1.7 x 1.9 cm
upper left renal cyst. Simple 3.3 x 2.1 x 3.0 cm interpolar left
renal cyst. Simple exophytic 3.0 x 2.6 x 2.3 cm lower left renal
cyst.

Bladder:

Appears normal for degree of bladder distention. Prominently
enlarged prostate with dimensions 5.7 x 5.4 x 5.9 cm (volume = 95
cm^3).
IMPRESSION: 1. No hydronephrosis .
2. Mildly echogenic kidneys indicating nonspecific renal parenchymal
disease of uncertain chronicity.
3. Simple renal cysts bilaterally.
4. Normal bladder.
5. Prominently enlarged prostate.
